# Patient Record
Sex: Male | Born: 1950
Health system: Southern US, Community
[De-identification: ages and names within clinical notes are randomized; demographics above are authoritative.]

## PROBLEM LIST (undated history)

## (undated) DIAGNOSIS — N529 Male erectile dysfunction, unspecified: Secondary | ICD-10-CM

## (undated) DIAGNOSIS — E786 Lipoprotein deficiency: Secondary | ICD-10-CM

## (undated) DIAGNOSIS — K635 Polyp of colon: Secondary | ICD-10-CM

## (undated) DIAGNOSIS — N200 Calculus of kidney: Secondary | ICD-10-CM

## (undated) DIAGNOSIS — I10 Essential (primary) hypertension: Secondary | ICD-10-CM

## (undated) DIAGNOSIS — E785 Hyperlipidemia, unspecified: Secondary | ICD-10-CM

## (undated) DIAGNOSIS — M79605 Pain in left leg: Secondary | ICD-10-CM

## (undated) DIAGNOSIS — R7302 Impaired glucose tolerance (oral): Secondary | ICD-10-CM

## (undated) DIAGNOSIS — F419 Anxiety disorder, unspecified: Secondary | ICD-10-CM

## (undated) DIAGNOSIS — K760 Fatty (change of) liver, not elsewhere classified: Secondary | ICD-10-CM

## (undated) HISTORY — DX: Essential (primary) hypertension: I10

## (undated) HISTORY — DX: Fatty (change of) liver, not elsewhere classified: K76.0

## (undated) HISTORY — DX: Calculus of kidney: N20.0

## (undated) HISTORY — DX: Impaired glucose tolerance (oral): R73.02

## (undated) HISTORY — DX: Anxiety disorder, unspecified: F41.9

## (undated) HISTORY — DX: Polyp of colon: K63.5

## (undated) HISTORY — DX: Male erectile dysfunction, unspecified: N52.9

## (undated) HISTORY — DX: Lipoprotein deficiency: E78.6

## (undated) HISTORY — DX: Pain in left leg: M79.605

---

## 1998-01-26 ENCOUNTER — Emergency Department (HOSPITAL_COMMUNITY): Admission: EM | Admit: 1998-01-26 | Discharge: 1998-01-26 | Payer: Self-pay | Admitting: Emergency Medicine

## 1998-03-07 ENCOUNTER — Ambulatory Visit (HOSPITAL_COMMUNITY): Admission: RE | Admit: 1998-03-07 | Discharge: 1998-03-07 | Payer: Self-pay | Admitting: Family Medicine

## 2001-02-02 ENCOUNTER — Encounter: Payer: Self-pay | Admitting: Emergency Medicine

## 2001-02-02 ENCOUNTER — Emergency Department (HOSPITAL_COMMUNITY): Admission: EM | Admit: 2001-02-02 | Discharge: 2001-02-02 | Payer: Self-pay | Admitting: Emergency Medicine

## 2001-02-03 ENCOUNTER — Emergency Department (HOSPITAL_COMMUNITY): Admission: EM | Admit: 2001-02-03 | Discharge: 2001-02-04 | Payer: Self-pay | Admitting: Emergency Medicine

## 2003-05-31 ENCOUNTER — Encounter (INDEPENDENT_AMBULATORY_CARE_PROVIDER_SITE_OTHER): Payer: Self-pay | Admitting: Specialist

## 2003-05-31 ENCOUNTER — Ambulatory Visit (HOSPITAL_COMMUNITY): Admission: RE | Admit: 2003-05-31 | Discharge: 2003-05-31 | Payer: Self-pay | Admitting: Gastroenterology

## 2005-06-14 ENCOUNTER — Emergency Department (HOSPITAL_COMMUNITY): Admission: EM | Admit: 2005-06-14 | Discharge: 2005-06-14 | Payer: Self-pay | Admitting: Emergency Medicine

## 2005-10-20 ENCOUNTER — Encounter: Payer: Self-pay | Admitting: Family Medicine

## 2007-01-03 ENCOUNTER — Emergency Department (HOSPITAL_COMMUNITY): Admission: EM | Admit: 2007-01-03 | Discharge: 2007-01-03 | Payer: Self-pay | Admitting: Emergency Medicine

## 2010-11-06 NOTE — Op Note (Signed)
NAME:  Keith Bauer, Keith Bauer                      ACCOUNT NO.:  [id number]   MEDICAL RECORD NO.:  [id number]                   PATIENT TYPE:  AMB   LOCATION:  ENDO                                 FACILITY:  [facility]   PHYSICIAN:  Keith Bauer. Keith Bauer., M.D.          DATE OF BIRTH:  [birth year]   DATE OF PROCEDURE:  05/31/2003  DATE OF DISCHARGE:                                 OPERATIVE REPORT   PROCEDURE:  Colonoscopy and coagulation of polyp.   MEDICATIONS:  Fentanyl 50 mcg, Versed 6 mg IV.   SCOPE:  Olympus pediatric adjustable colonoscope.   INDICATIONS FOR PROCEDURE:  Colon cancer screening.   DESCRIPTION OF PROCEDURE:  The procedure had been explained to the patient  and consent obtained. With the patient in the left lateral decubitus  position, the Olympus scope was inserted and advanced. The prep was marginal  in places but adequate.  We were able to reach the cecum, ileocecal valve  and appendiceal orifice were seen. The scope was withdrawn and the cecum,  ascending colon, hepatic flexure, transverse colon, descending and sigmoid  colon were seen well.  No polyps or other lesions were seen. The scope was  withdrawn in the rectum and approximately 6-10 mm from the anal verge, a 3  mm sessile polyp was encountered and was removed with the hot biopsy  forceps.  No gross lesions were seen.  The scope was withdrawn and the  patient tolerated the procedure well.   ASSESSMENT:  Rectal polyp cauterized.   PLAN:  Routine post polypectomy instructions.  Will check path and if  adenomatous will recommend repeating in three years.                                               Keith Bauer. Keith Bauer., M.D.    Waldron Session  D:  05/31/2003  T:  06/01/2003  Job:  [id number]   cc:   Keith Bauer. Little, M.D.  [address]  [place]  [place] [address]  Fax: [phone]

## 2011-04-05 LAB — POCT CARDIAC MARKERS
CKMB, poc: <1 — ABNORMAL LOW
Myoglobin, poc: 73.3
Operator id: 277751
Troponin i, poc: <0.05

## 2011-05-22 ENCOUNTER — Encounter: Payer: Self-pay | Admitting: *Deleted

## 2011-05-22 ENCOUNTER — Emergency Department (INDEPENDENT_AMBULATORY_CARE_PROVIDER_SITE_OTHER): Payer: No Typology Code available for payment source

## 2011-05-22 ENCOUNTER — Emergency Department (HOSPITAL_BASED_OUTPATIENT_CLINIC_OR_DEPARTMENT_OTHER)
Admission: EM | Admit: 2011-05-22 | Discharge: 2011-05-22 | Disposition: A | Discharge status: Home / Self Care | Payer: No Typology Code available for payment source | Attending: Emergency Medicine | Admitting: Emergency Medicine

## 2011-05-22 DIAGNOSIS — R51 Headache: Secondary | ICD-10-CM

## 2011-05-22 DIAGNOSIS — H9319 Tinnitus, unspecified ear: Secondary | ICD-10-CM

## 2011-05-22 DIAGNOSIS — Z79899 Other long term (current) drug therapy: Secondary | ICD-10-CM | POA: Insufficient documentation

## 2011-05-22 DIAGNOSIS — M5380 Other specified dorsopathies, site unspecified: Secondary | ICD-10-CM | POA: Insufficient documentation
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
## 2011-09-27 HISTORY — PX: COLONOSCOPY: SHX174

## 2011-10-15 ENCOUNTER — Other Ambulatory Visit: Payer: Self-pay | Admitting: Gastroenterology

## 2012-04-27 ENCOUNTER — Other Ambulatory Visit: Payer: Self-pay | Admitting: Family Medicine

## 2012-04-27 DIAGNOSIS — R109 Unspecified abdominal pain: Secondary | ICD-10-CM

## 2012-04-28 ENCOUNTER — Ambulatory Visit
Admission: RE | Admit: 2012-04-28 | Discharge: 2012-04-28 | Disposition: A | Discharge status: Home / Self Care | Payer: BC Managed Care – PPO | Source: Ambulatory Visit | Attending: Family Medicine | Admitting: Family Medicine

## 2012-04-28 DIAGNOSIS — R109 Unspecified abdominal pain: Secondary | ICD-10-CM

## 2012-04-28 MED ORDER — IOHEXOL 300 MG/ML  SOLN
100.0000 mL | Freq: Once | INTRAMUSCULAR | Status: AC | PRN
  Administered 2012-04-28: 100 mL via INTRAVENOUS

## 2012-11-19 ENCOUNTER — Emergency Department (HOSPITAL_COMMUNITY): Payer: BC Managed Care – PPO

## 2012-11-19 ENCOUNTER — Emergency Department (HOSPITAL_COMMUNITY)
Admission: EM | Admit: 2012-11-19 | Discharge: 2012-11-19 | Disposition: A | Discharge status: Home / Self Care | Payer: BC Managed Care – PPO | Attending: Emergency Medicine | Admitting: Emergency Medicine

## 2012-11-19 ENCOUNTER — Encounter (HOSPITAL_COMMUNITY): Payer: Self-pay | Admitting: Emergency Medicine

## 2012-11-19 DIAGNOSIS — R079 Chest pain, unspecified: Secondary | ICD-10-CM

## 2012-11-19 DIAGNOSIS — E785 Hyperlipidemia, unspecified: Secondary | ICD-10-CM | POA: Insufficient documentation

## 2012-11-19 DIAGNOSIS — R0989 Other specified symptoms and signs involving the circulatory and respiratory systems: Secondary | ICD-10-CM | POA: Insufficient documentation

## 2012-11-19 DIAGNOSIS — R0600 Dyspnea, unspecified: Secondary | ICD-10-CM

## 2012-11-19 DIAGNOSIS — Z7982 Long term (current) use of aspirin: Secondary | ICD-10-CM | POA: Insufficient documentation

## 2012-11-19 DIAGNOSIS — R0789 Other chest pain: Secondary | ICD-10-CM | POA: Insufficient documentation

## 2012-11-19 DIAGNOSIS — R0609 Other forms of dyspnea: Secondary | ICD-10-CM | POA: Insufficient documentation

## 2012-11-19 DIAGNOSIS — Z79899 Other long term (current) drug therapy: Secondary | ICD-10-CM | POA: Insufficient documentation

## 2012-11-19 HISTORY — DX: Hyperlipidemia, unspecified: E78.5

## 2012-11-19 LAB — POCT I-STAT, CHEM 8
Calcium, Ion: 1.22 mmol/L (ref 1.13–1.30)
Creatinine, Ser: 1.1 mg/dL (ref 0.50–1.35)
Glucose, Bld: 139 mg/dL — ABNORMAL HIGH (ref 70–99)
HCT: 44 % (ref 39.0–52.0)
Hemoglobin: 15 g/dL (ref 13.0–17.0)
Potassium: 3.7 mEq/L (ref 3.5–5.1)
TCO2: 28 mmol/L (ref 0–100)

## 2012-11-19 LAB — CBC WITH DIFFERENTIAL/PLATELET
Basophils Relative: 1 % (ref 0–1)
Eosinophils Absolute: 0.1 10*3/uL (ref 0.0–0.7)
Eosinophils Relative: 5 % (ref 0–5)
Hemoglobin: 15.4 g/dL (ref 13.0–17.0)
Lymphs Abs: 1 10*3/uL (ref 0.7–4.0)
MCH: 31.9 pg (ref 26.0–34.0)
MCHC: 36.1 g/dL — ABNORMAL HIGH (ref 30.0–36.0)
MCV: 88.4 fL (ref 78.0–100.0)
Monocytes Absolute: 0.3 10*3/uL (ref 0.1–1.0)
Monocytes Relative: 10 % (ref 3–12)
Neutrophils Relative %: 51 % (ref 43–77)
RBC: 4.83 MIL/uL (ref 4.22–5.81)

## 2012-11-19 NOTE — ED Notes (Signed)
Patient with increased shortness of breath in the last day, states that it feels like someone is sitting on his chest.  Patient denies any chest pain.  Patient denies any nausea or vomiting.  Patient is CAOx3 at this time.

## 2012-11-19 NOTE — ED Provider Notes (Signed)
History     CSN: [id number]  Arrival date & time 11/19/12  0153   First MD Initiated Contact with Patient 11/19/12 0301      Chief Complaint  Patient presents with  . Shortness of Breath    (Consider location/radiation/quality/duration/timing/severity/associated sxs/prior treatment) The history is provided by the patient.   patient states that his been having episodes of shortness of breath over the last couple weeks. He states it come and go last a few breaths. No chest pain up until he woke tonight. He states he had chest pressure at that time. The shortness of breath has not limited his activity. The pain is resolved now. It lasted an hour. No lightheadedness dizziness. No nausea. No diaphoresis. He has a history of hyperlipidemia but no known coronary disease. He does not smoke.  Past Medical History  Diagnosis Date  . Hyperlipemia     History reviewed. No pertinent past surgical history.  History reviewed. No pertinent family history.  History  Substance Use Topics  . Smoking status: Never Smoker   . Smokeless tobacco: Not on file  . Alcohol Use: Yes     Comment: occ      Review of Systems  Constitutional: Negative for activity change and appetite change.  HENT: Negative for neck stiffness.   Eyes: Negative for pain.  Respiratory: Positive for chest tightness and shortness of breath.   Cardiovascular: Negative for chest pain and leg swelling.  Gastrointestinal: Negative for nausea, vomiting, abdominal pain and diarrhea.  Genitourinary: Negative for flank pain.  Musculoskeletal: Negative for back pain.  Skin: Negative for rash.  Neurological: Negative for weakness, numbness and headaches.  Psychiatric/Behavioral: Negative for behavioral problems.    Allergies  Review of patient's allergies indicates no known allergies.  Home Medications   Current Outpatient Rx  Name  Route  Sig  Dispense  Refill  . ALPRAZolam (XANAX) 0.5 MG tablet   Oral   Take 0.25-0.5  mg by mouth daily as needed for anxiety.         Marland Kitchen aspirin 325 MG tablet   Oral   Take 325 mg by mouth daily.           Marland Kitchen atorvastatin (LIPITOR) 10 MG tablet   Oral   Take 10 mg by mouth daily.           . Multiple Vitamins-Minerals (MULTIVITAMINS THER. W/MINERALS) TABS   Oral   Take 1 tablet by mouth daily.           . sildenafil (VIAGRA) 100 MG tablet   Oral   Take 100 mg by mouth daily as needed for erectile dysfunction.           BP 136/87  Pulse 71  Resp 15  SpO2 99%  Physical Exam  Nursing note and vitals reviewed. Constitutional: He is oriented to person, place, and time. He appears well-developed and well-nourished.  HENT:  Head: Normocephalic and atraumatic.  Eyes: EOM are normal. Pupils are equal, round, and reactive to light.  Neck: Normal range of motion. Neck supple.  Cardiovascular: Normal rate, regular rhythm and normal heart sounds.   No murmur heard. Pulmonary/Chest: Effort normal and breath sounds normal.  Abdominal: Soft. Bowel sounds are normal. He exhibits no distension and no mass. There is no tenderness. There is no rebound and no guarding.  Musculoskeletal: Normal range of motion. He exhibits no edema.  Neurological: He is alert and oriented to person, place, and time. No cranial nerve deficit.  Skin: Skin is warm and dry.  Psychiatric: He has a normal mood and affect.    ED Course  Procedures (including critical care time)  Labs Reviewed  CBC WITH DIFFERENTIAL - Abnormal; Notable for the following:    WBC 2.9 (*)    MCHC 36.1 (*)    Platelets 131 (*)    Neutro Abs 1.5 (*)    All other components within normal limits  POCT I-STAT, CHEM 8 - Abnormal; Notable for the following:    Glucose, Bld 139 (*)    All other components within normal limits  POCT I-STAT TROPONIN I   Dg Chest 2 View  11/19/2012   *RADIOLOGY REPORT*  Clinical Data: Shortness of breath, chest tightness  CHEST - 2 VIEW  Comparison: 05/16/2011  Findings: Lungs  are clear. No pleural effusion or pneumothorax. The cardiomediastinal contours are within normal limits. The visualized bones and soft tissues are without significant appreciable abnormality.  IMPRESSION: No radiographic evidence of acute cardiopulmonary process.   Original Report Authenticated By: Jearld Lesch, M.D.     1. Dyspnea   2. Chest pain      Date: 11/19/2012  Rate: 67  Rhythm: normal sinus rhythm and premature ventricular contractions (PVC)  QRS Axis: normal  Intervals: normal  ST/T Wave abnormalities: nonspecific ST/T changes  Conduction Disutrbances:none  Narrative Interpretation:   Old EKG Reviewed: none available    MDM  Patient with episodes of dyspnea for the last couple weeks. Also episode of chest pain today. EKG is overall reassuring. Enzymes are negative. Pain-free now. He's not had any dyspnea with exertion. He'll be discharged home follow up with his Dr.        Harrold Donath R. Rubin Payor, MD 11/19/12 [phone]

## 2015-09-09 ENCOUNTER — Telehealth: Payer: Self-pay | Admitting: Hematology

## 2015-09-09 ENCOUNTER — Encounter: Payer: Self-pay | Admitting: Hematology

## 2015-09-09 NOTE — Telephone Encounter (Signed)
Sent out New Pt Ref letter to ref provider. Sent out new pt packet to pt.

## 2015-09-17 ENCOUNTER — Ambulatory Visit (INDEPENDENT_AMBULATORY_CARE_PROVIDER_SITE_OTHER): Payer: BLUE CROSS/BLUE SHIELD | Admitting: Neurology

## 2015-09-17 ENCOUNTER — Encounter: Payer: Self-pay | Admitting: Neurology

## 2015-09-17 VITALS — BP 137/84 | HR 67 | Ht 70.0 in | Wt 197.0 lb

## 2015-09-17 DIAGNOSIS — R55 Syncope and collapse: Secondary | ICD-10-CM

## 2015-09-17 DIAGNOSIS — IMO0002 Reserved for concepts with insufficient information to code with codable children: Secondary | ICD-10-CM
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
## 2015-09-23 ENCOUNTER — Ambulatory Visit
Admission: RE | Admit: 2015-09-23 | Discharge: 2015-09-23 | Disposition: A | Discharge status: Home / Self Care | Payer: BLUE CROSS/BLUE SHIELD | Source: Ambulatory Visit | Attending: Neurology | Admitting: Neurology

## 2015-09-23 DIAGNOSIS — IMO0002 Reserved for concepts with insufficient information to code with codable children: Secondary | ICD-10-CM

## 2015-09-23 DIAGNOSIS — R55 Syncope and collapse: Secondary | ICD-10-CM | POA: Diagnosis not present

## 2015-09-24 ENCOUNTER — Ambulatory Visit (HOSPITAL_BASED_OUTPATIENT_CLINIC_OR_DEPARTMENT_OTHER): Payer: BLUE CROSS/BLUE SHIELD | Admitting: Hematology

## 2015-09-24 ENCOUNTER — Telehealth: Payer: Self-pay | Admitting: Hematology

## 2015-09-24 ENCOUNTER — Other Ambulatory Visit (HOSPITAL_BASED_OUTPATIENT_CLINIC_OR_DEPARTMENT_OTHER): Payer: BLUE CROSS/BLUE SHIELD

## 2015-09-24 ENCOUNTER — Encounter: Payer: Self-pay | Admitting: Hematology

## 2015-09-24 VITALS — BP 132/74 | HR 72 | Temp 98.1°F | Resp 18 | Ht 70.0 in | Wt 197.2 lb

## 2015-09-24 DIAGNOSIS — D696 Thrombocytopenia, unspecified: Secondary | ICD-10-CM

## 2015-09-24 DIAGNOSIS — Z808 Family history of malignant neoplasm of other organs or systems: Secondary | ICD-10-CM

## 2015-09-24 DIAGNOSIS — D72819 Decreased white blood cell count, unspecified: Secondary | ICD-10-CM | POA: Insufficient documentation

## 2015-09-24 DIAGNOSIS — Z800 Family history of malignant neoplasm of digestive organs: Secondary | ICD-10-CM

## 2015-09-24 DIAGNOSIS — Z8041 Family history of malignant neoplasm of ovary: Secondary | ICD-10-CM

## 2015-09-24 DIAGNOSIS — D709 Neutropenia, unspecified: Secondary | ICD-10-CM | POA: Diagnosis not present

## 2015-09-24 LAB — CHCC SMEAR

## 2015-09-24 LAB — CBC & DIFF AND RETIC
BASO%: 1.6 % (ref 0.0–2.0)
Basophils Absolute: 0 10*3/uL (ref 0.0–0.1)
EOS%: 2.4 % (ref 0.0–7.0)
Eosinophils Absolute: 0.1 10*3/uL (ref 0.0–0.5)
HEMATOCRIT: 46 % (ref 38.4–49.9)
HGB: 16.2 g/dL (ref 13.0–17.1)
Immature Retic Fract: 4.3 % (ref 3.00–10.60)
LYMPH#: 1.1 10*3/uL (ref 0.9–3.3)
LYMPH%: 45.7 % (ref 14.0–49.0)
MCH: 31.5 pg (ref 27.2–33.4)
MCHC: 35.2 g/dL (ref 32.0–36.0)
MCV: 89.3 fL (ref 79.3–98.0)
MONO#: 0.2 10*3/uL (ref 0.1–0.9)
MONO%: 9.7 % (ref 0.0–14.0)
NEUT#: 1 10*3/uL — ABNORMAL LOW (ref 1.5–6.5)
NEUT%: 40.6 % (ref 39.0–75.0)
NRBC: 0 % (ref 0–0)
Platelets: 136 10*3/uL — ABNORMAL LOW (ref 140–400)
RBC: 5.15 10*6/uL (ref 4.20–5.82)
RDW: 13.4 % (ref 11.0–14.6)
RETIC %: 1.21 % (ref 0.80–1.80)
Retic Ct Abs: 62.32 10*3/uL (ref 34.80–93.90)
WBC: 2.5 10*3/uL — ABNORMAL LOW (ref 4.0–10.3)

## 2015-09-24 LAB — COMPREHENSIVE METABOLIC PANEL
ALK PHOS: 52 U/L (ref 40–150)
ALT: 31 U/L (ref 0–55)
AST: 24 U/L (ref 5–34)
Albumin: 3.7 g/dL (ref 3.5–5.0)
Anion Gap: 5 mEq/L (ref 3–11)
BUN: 15.5 mg/dL (ref 7.0–26.0)
CALCIUM: 9.3 mg/dL (ref 8.4–10.4)
CHLORIDE: 106 meq/L (ref 98–109)
CO2: 30 mEq/L — ABNORMAL HIGH (ref 22–29)
Creatinine: 1.3 mg/dL (ref 0.7–1.3)
EGFR: 68 mL/min/{1.73_m2} — AB (ref >90)
Glucose: 94 mg/dl (ref 70–140)
POTASSIUM: 4.5 meq/L (ref 3.5–5.1)
SODIUM: 141 meq/L (ref 136–145)
Total Bilirubin: 0.48 mg/dL (ref 0.20–1.20)
Total Protein: 7 g/dL (ref 6.4–8.3)

## 2015-09-24 LAB — LACTATE DEHYDROGENASE: LDH: 143 U/L (ref 125–245)

## 2015-09-24 NOTE — Telephone Encounter (Signed)
Gave and pritned appt sched and avs for pt for OCT °

## 2015-09-24 NOTE — Progress Notes (Signed)
Marland Kitchen    HEMATOLOGY/ONCOLOGY CONSULTATION NOTE  Date of Service: 09/24/2015  Patient Care Team: Hulan Fess, MD as PCP - General (Family Medicine)  CHIEF COMPLAINTS/PURPOSE OF CONSULTATION:  Thrombocytopenia and leukopenia  HISTORY OF PRESENTING ILLNESS:  Keith Bauer is a wonderful 65 y.o. male who has been referred to [place] by Dr Gennette Pac, MD for evaluation and management of thrombocytopenia and leukopenia.  Patient has a history of hypertension, dyslipidemia, impaired glucose tolerance, anxiety, erectile dysfunction, fatty liver noted on CT scan of the abdomen and pelvis in 2013, nephrolithiasis. He was referred for evaluation of his chronic leukopenia and thrombocytopenia. Based on review of his labs sent by his primary care physician his hemoglobin has been normal. His platelet counts have been low at least from February 2015 when they were 138k there were 148,000 on 08/22/2014 and recently were 125k on 09/05/2015.  His WBC counts have been around 2.5k with the highest number since 08/03/2013 of 2.8k on 03/15/2014. Noted to have neutropenia with an [place] fluctuating between [phone]. ANC was 1000 on 09/05/2015 with a total WBC count of 2.5k.  He reports no issues with frequent infections or uncontrolled infection. No issues with bleeding or easy bruisability. He had a hepatitis C test on 08/01/2014 which was negative. HIV testing on 08/01/2014 was nonreactive.  Patient notes no familial history of blood disorders, problems of infection.  He notes that he feels well. No fevers or chills. No unexpected weight loss. Good energy levels. No other acute new focal symptoms. Thus take a multivitamin daily. No other recent new medications.Marland Kitchen     MEDICAL HISTORY:  Past Medical History  Diagnosis Date  . Hyperlipemia   . Hypertension   . Impaired glucose tolerance   . Anxiety   . Colon polyp   . Nephrolithiasis     left kidney  . Fatty liver   . Low HDL (under 40)   . Erectile  dysfunction     SURGICAL HISTORY: Past Surgical History  Procedure Laterality Date  . Colonoscopy  04, 08, 13    SOCIAL HISTORY: Social History   Social History  . Marital Status: Single    Spouse Name: N/A  . Number of Children: 3  . Years of Education: 12+   Occupational History  . Social research officer, government   Social History Main Topics  . Smoking status: Never Smoker   . Smokeless tobacco: Not on file  . Alcohol Use: 0.0 oz/week    0 Standard drinks or equivalent per week     Comment: 3-5 drinks per week  . Drug Use: No  . Sexual Activity: Not on file   Other Topics Concern  . Not on file   Social History Narrative   Lives at home alone.   Right-handed.   8-12 ounces caffeine per day.    FAMILY HISTORY: Family History  Problem Relation Age of Onset  . Liver cancer Father   . Cancer Mother     colon, rectal, ovarian  . Heart disease Brother   . Liver disease Sister     ALLERGIES:  has No Known Allergies.  MEDICATIONS:  Current Outpatient Prescriptions  Medication Sig Dispense Refill  . ALPRAZolam (XANAX) 0.5 MG tablet Take 0.25-0.5 mg by mouth daily as needed for anxiety.    Marland Kitchen aspirin 325 MG tablet Take 325 mg by mouth daily.      Marland Kitchen atorvastatin (LIPITOR) 10 MG tablet Take 10 mg by mouth daily.      Marland Kitchen  Multiple Vitamins-Minerals (MULTIVITAMINS THER. W/MINERALS) TABS Take 1 tablet by mouth daily.      Marland Kitchen PARoxetine (PAXIL) 20 MG tablet TK 1 T PO QD IN THE MORNING  3  . sildenafil (VIAGRA) 100 MG tablet Take 100 mg by mouth daily as needed for erectile dysfunction.     No current facility-administered medications for this visit.    REVIEW OF SYSTEMS:    10 Point review of Systems was done is negative except as noted above.  PHYSICAL EXAMINATION: ECOG PERFORMANCE STATUS: 0 - Asymptomatic  . Filed Vitals:   09/24/15 1053  BP: 132/74  Pulse: 72  Temp: 98.1 F (36.7 C)  Resp: 18   Filed Weights   09/24/15 1053  Weight: 197 lb  3.2 oz (89.449 kg)   .Body mass index is 28.3 kg/(m^2).  GENERAL:alert, in no acute distress and comfortable SKIN: skin color, texture, turgor are normal, no rashes or significant lesions EYES: normal, conjunctiva are pink and non-injected, sclera clear OROPHARYNX:no exudate, no erythema and lips, buccal mucosa, and tongue normal  NECK: supple, no JVD, thyroid normal size, non-tender, without nodularity LYMPH:  no palpable lymphadenopathy in the cervical, axillary or inguinal LUNGS: clear to auscultation with normal respiratory effort HEART: regular rate & rhythm,  no murmurs and no lower extremity edema ABDOMEN: abdomen soft, non-tender, normoactive bowel sounds , No palpable hepatosplenomegaly  Musculoskeletal: no cyanosis of digits and no clubbing  PSYCH: alert & oriented x 3 with fluent speech NEURO: no focal motor/sensory deficits  LABORATORY DATA:  I have reviewed the data as listed  . CBC Latest Ref Rng 11/19/2012 11/19/2012  WBC 4.0 - 10.5 K/uL - 2.9(L)  Hemoglobin 13.0 - 17.0 g/dL 15.0 15.4  Hematocrit 39.0 - 52.0 % 44.0 42.7  Platelets 150 - 400 K/uL - 131(L)    . CMP Latest Ref Rng 11/19/2012  Glucose 70 - 99 mg/dL 139(H)  BUN 6 - 23 mg/dL 14  Creatinine 0.50 - 1.35 mg/dL 1.10  Sodium 135 - 145 mEq/L 142  Potassium 3.5 - 5.1 mEq/L 3.7  Chloride 96 - 112 mEq/L 105     RADIOGRAPHIC STUDIES: I have personally reviewed the radiological images as listed and agreed with the findings in the report. No results found.  ASSESSMENT & PLAN:   65 year old African-American male with  #1 Chronic thrombocytopenia at least from June 2014. Without significant worsening. No issues with bleeding despite being on a full dose aspirin daily.  Denies drug abuse/alcohol abuse. No fevers/chills/night sweats/unexpected weight loss/lymphadenopathy/hepatosplenomegaly to suggest a lymphoproliferative disorder. Cannot rule out an element of clumping. No clinical stigmata of chronic  autoimmune disease.  #2 mild leukopenia with neutropenia. No issues with frequent infections. This is also been at least since June 2014. 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 No evidence of overt autoimmune condition or specific medication exposures. Denies alcohol abuse or drug use. Plan -We'll get a CBC, CMP, peripheral blood smear, B12, LDH, copper. -No indication for G-CSF at this time. -We'll review the peripheral blood smear to rule out any unusual lymphocytes suggesting chronic lymphoproliferative process. Would also help ruling out pseudothrombocytopenia due to EDTA-related platelet clumping. -No clear indications for bone marrow biopsy at this time. -Primary care physician might consider an ultrasound of the abdomen to evaluate his liver again and spleen size to rule out splenomegaly as an etiology for his chronic stable cytopenias . -No problems with continuing aspirin at this time given his current platelet count. Would hold this if for whatever reason the  platelet counts drop to under 75k.  We will call the patient with his lab results as they become available. Unless anything else concerning is noted we will plan to see the patient back in 6 months to monitor his blood counts at least once more and then discharge him to his primary care physician if no other concerns arise.     Orders Placed This Encounter  Procedures  . CBC & Diff and Retic    Standing Status: Future     Number of Occurrences:      Standing Expiration Date: 10/28/2016  . Comprehensive metabolic panel    Standing Status: Future     Number of Occurrences:      Standing Expiration Date: 09/23/2016  . Smear    Standing Status: Future     Number of Occurrences:      Standing Expiration Date: 09/23/2016  . Lactate dehydrogenase (LDH)    Standing Status: Future     Number of Occurrences:      Standing Expiration Date: 09/23/2016  . Vitamin B12    Standing Status: Future     Number of Occurrences:      Standing  Expiration Date: 09/23/2016  . Copper, serum    Standing Status: Future     Number of Occurrences:      Standing Expiration Date: 09/23/2016  . CBC & Diff and Retic    Standing Status: Future     Number of Occurrences:      Standing Expiration Date: 10/28/2016  . Comprehensive metabolic panel    Standing Status: Future     Number of Occurrences:      Standing Expiration Date: 09/23/2016    Plan of care was discussed with the patient and he is agreeable. All of the patients questions were answered  to his apparent satisfaction. The patient knows to call the clinic with any problems, questions or concerns.  I spent 45 minutes counseling the patient face to face. The total time spent in the appointment was 55 minutes and more than 50% was on counseling and direct patient cares.    Sullivan Lone MD [place] AAHIVMS [facility] Hematology/Oncology Physician [facility]  (Office):       [phone] (Work cell):  [phone] (Fax):           [phone]  09/24/2015 10:42 AM

## 2015-09-24 NOTE — Addendum Note (Signed)
Addended by: Kellie Simmering A on: 09/24/2015 12:39 PM   Modules accepted: Medications

## 2015-09-25 LAB — VITAMIN B12: Vitamin B12: 495 pg/mL (ref 211–946)

## 2015-09-26 LAB — COPPER, SERUM: Copper: 83 ug/dL (ref 72–166)

## 2015-09-30 ENCOUNTER — Ambulatory Visit (INDEPENDENT_AMBULATORY_CARE_PROVIDER_SITE_OTHER): Payer: BLUE CROSS/BLUE SHIELD

## 2015-09-30 DIAGNOSIS — R55 Syncope and collapse: Secondary | ICD-10-CM

## 2015-09-30 DIAGNOSIS — IMO0002 Reserved for concepts with insufficient information to code with codable children: Secondary | ICD-10-CM

## 2015-09-30 NOTE — Progress Notes (Signed)
The results of the EEG study were discussed with the patient. The study was unremarkable.

## 2015-09-30 NOTE — Procedures (Signed)
    History:  Keith Bauer is a 65 year old gentleman with a history of episodes of syncope that occurred 8 times over the last 6 years. The patient last had an event in September 2016 associated with racing of the heart, lightheaded sensations, and vomiting. The patient is being evaluated for these episodes.  This is a routine EEG. No skull defects are noted. Medications include Xanax, aspirin, Lipitor, Cozaar, multivitamins, Paxil, and Viagra.   EEG classification: Normal awake and drowsy  Description of the recording: The background rhythms of this recording consists of a fairly well modulated medium amplitude alpha rhythm of 10 Hz that is reactive to eye opening and closure. As the record progresses, the patient appears to remain in the waking state throughout the recording. Photic stimulation was performed, resulting in a bilateral and symmetric photic driving response. Hyperventilation was also performed, resulting in a minimal buildup of the background rhythm activities without significant slowing seen. Toward the end of the recording, the patient enters the drowsy state with slight symmetric slowing seen. The patient never enters stage II sleep. At no time during the recording does there appear to be evidence of spike or spike wave discharges or evidence of focal slowing. EKG monitor shows no evidence of cardiac rhythm abnormalities with a heart rate of 66.  Impression: This is a normal EEG recording in the waking and drowsy state. No evidence of ictal or interictal discharges are seen.

## 2015-10-02 ENCOUNTER — Other Ambulatory Visit: Payer: Self-pay

## 2015-10-02 ENCOUNTER — Ambulatory Visit (HOSPITAL_COMMUNITY): Payer: BLUE CROSS/BLUE SHIELD | Attending: Cardiovascular Disease

## 2015-10-02 DIAGNOSIS — IMO0002 Reserved for concepts with insufficient information to code with codable children: Secondary | ICD-10-CM

## 2015-10-02 DIAGNOSIS — E785 Hyperlipidemia, unspecified: Secondary | ICD-10-CM | POA: Diagnosis not present

## 2015-10-02 DIAGNOSIS — I340 Nonrheumatic mitral (valve) insufficiency: Secondary | ICD-10-CM | POA: Diagnosis not present

## 2015-10-02 DIAGNOSIS — R55 Syncope and collapse: Secondary | ICD-10-CM

## 2015-10-06 ENCOUNTER — Telehealth: Payer: Self-pay | Admitting: Neurology

## 2015-10-06 NOTE — Telephone Encounter (Signed)
LVM about normal ECHO results per Dr Krista Blue. Gave [facility] phone number if he has further questions.

## 2015-10-06 NOTE — Telephone Encounter (Signed)
Please call patient for normal echocardiogram.

## 2015-10-30 ENCOUNTER — Encounter: Payer: Self-pay | Admitting: Neurology

## 2015-10-30 ENCOUNTER — Ambulatory Visit (INDEPENDENT_AMBULATORY_CARE_PROVIDER_SITE_OTHER): Payer: BLUE CROSS/BLUE SHIELD | Admitting: Neurology

## 2015-10-30 VITALS — BP 121/72 | HR 64 | Ht 70.0 in | Wt 196.8 lb

## 2015-10-30 DIAGNOSIS — R55 Syncope and collapse: Secondary | ICD-10-CM | POA: Diagnosis not present

## 2015-10-30 DIAGNOSIS — IMO0002 Reserved for concepts with insufficient information to code with codable children: Secondary | ICD-10-CM

## 2015-10-30 NOTE — Progress Notes (Signed)
Chief Complaint  Patient presents with  . Passout Episodes    He would like to review hie MRI, EEG and ECHO results. He has not worn a cardiac monitor yet.  Reports no further passing out episodes.      PATIENT: Keith Bauer DOB: 01-22-1951  Chief Complaint  Patient presents with  . Passout Episodes    He would like to review hie MRI, EEG and ECHO results. He has not worn a cardiac monitor yet.  Reports no further passing out episodes.     [place], seen in refer byHis primary care physician Dr. 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 , while he was taking pictures with his friend, he suddenly felt lightheaded, heart racing fast, he was able to sit down, later he lost consciousness, vomiting, he was in and out of consciousness few times in a short period of time, there was no seizure activity noticed, he denies tongue biting, no urinary incontinence, he was taken to [facility], I was able to review laboratory evaluations, normal CMP with exception of elevated glucose 170, normal CBC with exception of mildly low platelet 139, troponin was negative, mild elevated CPK 230  Over the past 6 years, since 2011, he had intermittent 8 episodes of passing out episode, usually preceded by heart racing fast, sweaty, lightheadedness sensation, he often has reaction time to find a secure spot to sit down, he never had self injury   He denies history of seizure, there was no seizure activity described, he does see a psychiatrist for anxiety related to driving through bridge, prolonged traveling in the car  Laboratory evaluation  in September 05 2015, showed low WBC 2.5, hemoglobin 17 point 4 which was elevated, platelet 148 mildly decreased, normal CMP with exception of mild elevated glucose 103, creatinine was 1.25, with GFR 58, normal PSA 0.87, mild elevated LDL 102    Update Oct 30 2015:  I personally reviewed MRI brain without contrast that was normal, EEG was normal, echocardiogram was normal, Cardiac monitoring is pending, he has no recurrent episode, continue exercise without difficulty  REVIEW OF SYSTEMS: Full 14 system review of systems performed and notable only for as above   ALLERGIES: No Known Allergies  HOME MEDICATIONS: Current Outpatient Prescriptions  Medication Sig Dispense Refill  . ALPRAZolam (XANAX) 0.5 MG tablet Take 0.25-0.5 mg by mouth daily as needed for anxiety.    Marland Kitchen aspirin 325 MG tablet Take 325 mg by mouth daily.      Marland Kitchen atorvastatin (LIPITOR) 10 MG tablet Take 10 mg by mouth daily.      Marland Kitchen losartan (COZAAR) 50 MG tablet Take 50 mg by mouth daily.    . Multiple Vitamins-Minerals (MULTIVITAMINS THER. W/MINERALS) TABS Take 1 tablet by mouth daily.      Marland Kitchen PARoxetine (PAXIL) 20 MG tablet TK 1 T PO QD IN THE MORNING  3  . sildenafil (VIAGRA) 100 MG tablet Take 100 mg by mouth daily as needed for erectile dysfunction.     No current facility-administered medications for this visit.    PAST MEDICAL HISTORY: Past Medical History  Diagnosis Date  . Hyperlipemia   .  Hypertension   . Impaired glucose tolerance   . Anxiety   . Colon polyp   . Nephrolithiasis     left kidney  . Fatty liver   . Low HDL (under 40)   . Erectile dysfunction     PAST SURGICAL HISTORY: Past Surgical History  Procedure Laterality Date  . Colonoscopy  04, 08, 13    FAMILY HISTORY: Family History  Problem Relation Age of Onset  . Liver cancer Father   . Cancer Mother     colon, rectal, ovarian  . Heart disease Brother   . Liver disease Sister     SOCIAL HISTORY:  Social History   Social History    . Marital Status: Single    Spouse Name: N/A  . Number of Children: 3  . Years of Education: 12+   Occupational History  . Social research officer, government   Social History Main Topics  . Smoking status: Never Smoker   . Smokeless tobacco: Not on file  . Alcohol Use: 0.0 oz/week    0 Standard drinks or equivalent per week     Comment: 3-5 drinks per week  . Drug Use: No  . Sexual Activity: Not on file   Other Topics Concern  . Not on file   Social History Narrative   Lives at home alone.   Right-handed.   8-12 ounces caffeine per day.     PHYSICAL EXAM   Filed Vitals:   10/30/15 1628  BP: 121/72  Pulse: 64  Height: 5\' 10"  (1.778 m)  Weight: 196 lb 12 oz (89.245 kg)    Not recorded      Body mass index is 28.23 kg/(m^2).  PHYSICAL EXAMNIATION:  Gen: NAD, conversant, well nourised, obese, well groomed                     Cardiovascular: Regular rate rhythm, no peripheral edema, warm, nontender. Eyes: Conjunctivae clear without exudates or hemorrhage Neck: Supple, no carotid bruise. Pulmonary: Clear to auscultation bilaterally   NEUROLOGICAL EXAM:  MENTAL STATUS: Speech:    Speech is normal; fluent and spontaneous with normal comprehension.  Cognition:     Orientation to time, place and person     Normal recent and remote memory     Normal Attention span and concentration     Normal Language, naming, repeating,spontaneous speech     Fund of knowledge   CRANIAL NERVES: CN II: Visual fields are full to confrontation. Fundoscopic exam is normal with sharp discs and no vascular changes. Pupils are round equal and briskly reactive to light. CN III, IV, VI: extraocular movement are normal. No ptosis. CN V: Facial sensation is intact to pinprick in all 3 divisions bilaterally. Corneal responses are intact.  CN VII: Face is symmetric with normal eye closure and smile. CN VIII: Hearing is normal to rubbing fingers CN IX, X: Palate elevates  symmetrically. Phonation is normal. CN XI: Head turning and shoulder shrug are intact CN XII: Tongue is midline with normal movements and no atrophy.  MOTOR: There is no pronator drift of out-stretched arms. Muscle bulk and tone are normal. Muscle strength is normal.  REFLEXES: Reflexes are 2+ and symmetric at the biceps, triceps, knees, and ankles. Plantar responses are flexor.  SENSORY: Intact to light touch, pinprick, positional sensation and vibratory sensation are intact in fingers and toes.  COORDINATION: Rapid alternating movements and fine finger movements are intact. There is no dysmetria on finger-to-nose  and heel-knee-shin.    GAIT/STANCE: Posture is normal. Gait is steady with normal steps, base, arm swing, and turning. Heel and toe walking are normal. Tandem gait is normal.  Romberg is absent.   DIAGNOSTIC DATA (LABS, IMAGING, TESTING) - I reviewed patient records, labs, notes, testing and imaging myself where available.   ASSESSMENT AND PLAN  Keith Bauer is a 65 y.o. male    Passing out episode:  Most suspicious for cardiac etiology such as cardiac arrhythmia  Refer him to Cardiac monitoring  Marcial Pacas, M.D. Ph.D.  [facility] [address], [place], [place] [address] Ph: [phone] Fax: [phone]  CC: Hulan Fess, MD

## 2015-11-04 ENCOUNTER — Ambulatory Visit (INDEPENDENT_AMBULATORY_CARE_PROVIDER_SITE_OTHER): Payer: BLUE CROSS/BLUE SHIELD

## 2015-11-04 DIAGNOSIS — R55 Syncope and collapse: Secondary | ICD-10-CM

## 2016-03-29 ENCOUNTER — Telehealth: Payer: Self-pay | Admitting: Hematology

## 2016-03-29 NOTE — Telephone Encounter (Signed)
COVERING AP - MOVED FROM 10/11 TO 10/17. SPOKE WITH PATIENT HE IS AWARE.

## 2016-03-31 ENCOUNTER — Other Ambulatory Visit: Payer: BLUE CROSS/BLUE SHIELD

## 2016-03-31 ENCOUNTER — Ambulatory Visit: Payer: BLUE CROSS/BLUE SHIELD | Admitting: Hematology

## 2016-04-06 ENCOUNTER — Other Ambulatory Visit (HOSPITAL_BASED_OUTPATIENT_CLINIC_OR_DEPARTMENT_OTHER): Payer: BLUE CROSS/BLUE SHIELD

## 2016-04-06 ENCOUNTER — Ambulatory Visit (HOSPITAL_BASED_OUTPATIENT_CLINIC_OR_DEPARTMENT_OTHER): Payer: BLUE CROSS/BLUE SHIELD | Admitting: Hematology

## 2016-04-06 VITALS — BP 131/83 | HR 82 | Temp 98.3°F | Resp 18 | Ht 70.0 in | Wt 203.1 lb

## 2016-04-06 DIAGNOSIS — D696 Thrombocytopenia, unspecified: Secondary | ICD-10-CM | POA: Diagnosis not present

## 2016-04-06 DIAGNOSIS — D709 Neutropenia, unspecified: Secondary | ICD-10-CM

## 2016-04-06 DIAGNOSIS — D72819 Decreased white blood cell count, unspecified: Secondary | ICD-10-CM

## 2016-04-06 LAB — CBC & DIFF AND RETIC
BASO%: 0.7 % (ref 0.0–2.0)
Basophils Absolute: 0 10*3/uL (ref 0.0–0.1)
EOS%: 2.2 % (ref 0.0–7.0)
Eosinophils Absolute: 0.1 10*3/uL (ref 0.0–0.5)
HCT: 46.8 % (ref 38.4–49.9)
HGB: 16.2 g/dL (ref 13.0–17.1)
Immature Retic Fract: 5.6 % (ref 3.00–10.60)
LYMPH%: 37.7 % (ref 14.0–49.0)
MCH: 31.2 pg (ref 27.2–33.4)
MCHC: 34.6 g/dL (ref 32.0–36.0)
MCV: 90 fL (ref 79.3–98.0)
MONO#: 0.3 10*3/uL (ref 0.1–0.9)
MONO%: 11.6 % (ref 0.0–14.0)
NEUT%: 47.8 % (ref 39.0–75.0)
NEUTROS ABS: 1.3 10*3/uL — AB (ref 1.5–6.5)
Platelets: 129 10*3/uL — ABNORMAL LOW (ref 140–400)
RBC: 5.2 10*6/uL (ref 4.20–5.82)
RDW: 13.6 % (ref 11.0–14.6)
Retic %: 1.44 % (ref 0.80–1.80)
Retic Ct Abs: 74.88 10*3/uL (ref 34.80–93.90)
WBC: 2.8 10*3/uL — AB (ref 4.0–10.3)
lymph#: 1 10*3/uL (ref 0.9–3.3)

## 2016-04-06 LAB — COMPREHENSIVE METABOLIC PANEL
ALK PHOS: 64 U/L (ref 40–150)
ALT: 35 U/L (ref 0–55)
AST: 31 U/L (ref 5–34)
Albumin: 3.7 g/dL (ref 3.5–5.0)
Anion Gap: 10 mEq/L (ref 3–11)
BUN: 17.7 mg/dL (ref 7.0–26.0)
CALCIUM: 9.6 mg/dL (ref 8.4–10.4)
CHLORIDE: 104 meq/L (ref 98–109)
CO2: 28 mEq/L (ref 22–29)
Creatinine: 1.4 mg/dL — ABNORMAL HIGH (ref 0.7–1.3)
EGFR: 59 mL/min/{1.73_m2} — AB (ref >90)
GLUCOSE: 105 mg/dL (ref 70–140)
POTASSIUM: 4.2 meq/L (ref 3.5–5.1)
SODIUM: 142 meq/L (ref 136–145)
Total Bilirubin: 0.4 mg/dL (ref 0.20–1.20)
Total Protein: 7.3 g/dL (ref 6.4–8.3)

## 2016-05-03 ENCOUNTER — Encounter: Payer: Self-pay | Admitting: Neurology

## 2016-05-03 ENCOUNTER — Ambulatory Visit (INDEPENDENT_AMBULATORY_CARE_PROVIDER_SITE_OTHER): Payer: BLUE CROSS/BLUE SHIELD | Admitting: Neurology

## 2016-05-03 VITALS — BP 131/78 | HR 70 | Ht 70.0 in | Wt 198.5 lb

## 2016-05-03 DIAGNOSIS — R55 Syncope and collapse: Secondary | ICD-10-CM | POA: Diagnosis not present

## 2016-05-03 DIAGNOSIS — IMO0002 Reserved for concepts with insufficient information to code with codable children: Secondary | ICD-10-CM

## 2016-05-03 NOTE — Progress Notes (Signed)
Chief Complaint  Patient presents with  . Passing Out Episode    Reports one passing out event since last seen in May 2017.  He would like to further review his cardiac monitoring results.      PATIENT: Keith Bauer DOB: 1950-09-27  Chief Complaint  Patient presents with  . Passing Out Episode    Reports one passing out event since last seen in May 2017.  He would like to further review his cardiac monitoring results.     [place], seen in refer byHis primary care physician Dr. Hulan Fess for evaluation of recurrent episode of passing out, Initial evaluation was on September 17 2015  I reviewed and summarized the referring note, he had a history of hypertension, hyperlipidemia, chronic anxiety, impaired glucose intolerance, kidney stone, fatty liver  He reported recurrent episode of passing out, the most recent one was in September 2016, he just finished his 65 m competitions , while he was taking pictures with his friend, he suddenly felt lightheaded, heart racing fast, he was able to sit down, later he lost consciousness, vomiting, he was in and out of consciousness few times in a short period of time, there was no seizure activity noticed, he denies tongue biting, no urinary incontinence, he was taken to [facility], I was able to review laboratory evaluations, normal CMP with exception of elevated glucose 170, normal CBC with exception of mildly low platelet 139, troponin was negative, mild elevated CPK 230  Over the past 6 years, since 2011, he had intermittent 8 episodes of passing out episode, usually preceded by heart racing fast, sweaty, lightheadedness sensation, he often has reaction time to find a secure spot to sit down, he never had self injury   He denies history of seizure, there was no seizure activity described, he does see a psychiatrist for anxiety related to driving through bridge, prolonged traveling in the car  Laboratory  evaluation in September 05 2015, showed low WBC 2.5, hemoglobin 17 point 4 which was elevated, platelet 148 mildly decreased, normal CMP with exception of mild elevated glucose 103, creatinine was 1.25, with GFR 58, normal PSA 0.87, mild elevated LDL 102    Update Oct 30 2015:  I personally reviewed MRI brain without contrast that was normal, EEG was normal, echocardiogram was normal,  Update May 03 2016, He reported on episode in summer of 2017, he was walking in the park, felt lightheaded, he sat down at the bench, went to sleep, came around with out self injury, but it was very on usual to him to sleep in the park, 30 day cardiac monitoring in May 2017 was normal, patient reported that he did wear monitoring during that episode.   I have reviewed cardiac monitoring in May 2017, sinus rhythm, no arrhythmia detected  REVIEW OF SYSTEMS: Full 14 system review of systems performed and notable only for as above   ALLERGIES: No Known Allergies  HOME MEDICATIONS: Current Outpatient Prescriptions  Medication Sig Dispense Refill  . ALPRAZolam (XANAX) 0.5 MG tablet Take 0.25-0.5 mg by mouth daily as needed for anxiety.    Marland Kitchen aspirin 325 MG tablet Take 325 mg by mouth daily.      Marland Kitchen atorvastatin (LIPITOR) 10 MG tablet Take 10 mg by mouth daily.      Marland Kitchen losartan (COZAAR) 50 MG tablet Take 50 mg by mouth daily.    . Multiple Vitamins-Minerals (MULTIVITAMINS THER. W/MINERALS) TABS Take 1 tablet by mouth daily.      Marland Kitchen  PARoxetine (PAXIL) 20 MG tablet TK 1 T PO QD IN THE MORNING  3  . sildenafil (VIAGRA) 100 MG tablet Take 100 mg by mouth daily as needed for erectile dysfunction.     No current facility-administered medications for this visit.     PAST MEDICAL HISTORY: Past Medical History:  Diagnosis Date  . Anxiety   . Colon polyp   . Erectile dysfunction   . Fatty liver   . Hyperlipemia   . Hypertension   . Impaired glucose tolerance   . Low HDL (under 40)   . Nephrolithiasis    left  kidney    PAST SURGICAL HISTORY: Past Surgical History:  Procedure Laterality Date  . COLONOSCOPY  04, 08, 13    FAMILY HISTORY: Family History  Problem Relation Age of Onset  . Liver cancer Father   . Cancer Mother     colon, rectal, ovarian  . Heart disease Brother   . Liver disease Sister     SOCIAL HISTORY:  Social History   Social History  . Marital status: Single    Spouse name: N/A  . Number of children: 3  . Years of education: 12+   Occupational History  . Social research officer, government   Social History Main Topics  . Smoking status: Never Smoker  . Smokeless tobacco: Not on file  . Alcohol use 0.0 oz/week     Comment: 3-5 drinks per week  . Drug use: No  . Sexual activity: Not on file   Other Topics Concern  . Not on file   Social History Narrative   Lives at home alone.   Right-handed.   8-12 ounces caffeine per day.     PHYSICAL EXAM   Vitals:   05/03/16 1602  BP: 131/78  Pulse: 70  Weight: 198 lb 8 oz (90 kg)  Height: 5\' 10"  (1.778 m)    Not recorded      Body mass index is 28.48 kg/m.  PHYSICAL EXAMNIATION:  Gen: NAD, conversant, well nourised, obese, well groomed                     Cardiovascular: Regular rate rhythm, no peripheral edema, warm, nontender. Eyes: Conjunctivae clear without exudates or hemorrhage Neck: Supple, no carotid bruise. Pulmonary: Clear to auscultation bilaterally   NEUROLOGICAL EXAM:  MENTAL STATUS: Speech:    Speech is normal; fluent and spontaneous with normal comprehension.  Cognition:     Orientation to time, place and person     Normal recent and remote memory     Normal Attention span and concentration     Normal Language, naming, repeating,spontaneous speech     Fund of knowledge   CRANIAL NERVES: CN II: Visual fields are full to confrontation. Fundoscopic exam is normal with sharp discs and no vascular changes. Pupils are round equal and briskly reactive to light. CN  III, IV, VI: extraocular movement are normal. No ptosis. CN V: Facial sensation is intact to pinprick in all 3 divisions bilaterally. Corneal responses are intact.  CN VII: Face is symmetric with normal eye closure and smile. CN VIII: Hearing is normal to rubbing fingers CN IX, X: Palate elevates symmetrically. Phonation is normal. CN XI: Head turning and shoulder shrug are intact CN XII: Tongue is midline with normal movements and no atrophy.  MOTOR: There is no pronator drift of out-stretched arms. Muscle bulk and tone are normal. Muscle strength is normal.  REFLEXES: Reflexes are  2+ and symmetric at the biceps, triceps, knees, and ankles. Plantar responses are flexor.  SENSORY: Intact to light touch, pinprick, positional sensation and vibratory sensation are intact in fingers and toes.  COORDINATION: Rapid alternating movements and fine finger movements are intact. There is no dysmetria on finger-to-nose and heel-knee-shin.    GAIT/STANCE: Posture is normal. Gait is steady with normal steps, base, arm swing, and turning. Heel and toe walking are normal. Tandem gait is normal.  Romberg is absent.   DIAGNOSTIC DATA (LABS, IMAGING, TESTING) - I reviewed patient records, labs, notes, testing and imaging myself where available.   ASSESSMENT AND PLAN  DEBORA SHIRAH is a 65 y.o. male    Passing out episode:  Most suspicious for cardiac etiology such as cardiac arrhythmia  Refer him to cardiologist  Cardiac monitoring in May 2017 showed no significant abnormality  Neurology workup including MRI of the brain and EEG was normal  Advise him continues to document event  Marcial Pacas, M.D. Ph.D.  [facility] [address], [place], [place] [address] Ph: [phone] Fax: [phone]  CC: Hulan Fess, MD

## 2016-05-27 ENCOUNTER — Encounter: Payer: Self-pay | Admitting: Cardiology

## 2016-05-27 ENCOUNTER — Ambulatory Visit (INDEPENDENT_AMBULATORY_CARE_PROVIDER_SITE_OTHER): Payer: BLUE CROSS/BLUE SHIELD | Admitting: Cardiology

## 2016-05-27 DIAGNOSIS — R55 Syncope and collapse: Secondary | ICD-10-CM

## 2016-05-27 MED ORDER — METOPROLOL TARTRATE 25 MG PO TABS: 25.0000 mg | ORAL_TABLET | Freq: Two times a day (BID) | ORAL | 6 refills | Status: DC | PRN

## 2016-05-27 NOTE — Patient Instructions (Signed)
TAKE METOPROLOL TARTRATE  25 MG  UP TO TWICE A DAY TAKE HALF AN HOUR  PRIOR TO ANY ANXIOUS SITUATION OR DRIVING LONG DISTANCE AS NEEDED.  Your physician wants you to follow-up in:  [place].You will receive a reminder letter in the mail two months in advance. If you don't receive a letter, please call our office to schedule the follow-up appointment.  If you need a refill on your cardiac medications before your next appointment, please call your pharmacy.

## 2016-05-27 NOTE — Progress Notes (Signed)
PCP: Gennette Pac, MD  Clinic Note: Chief Complaint  Patient presents with  . New Patient (Initial Visit)    pt states he is here due to having thoughts of passing out when he is in stressful situations, example driving for long periods of time and crossing over a bridge with water under it   . Loss of Consciousness    1 episode about a year+ ago.  Otw near syncope.    HPI: Keith Bauer is a 65 y.o. male with a PMH below who presents today for cardiology consultation to evaluate history of syncope. Apparently this was evaluated with an echocardiogram, EEG and MRI - by Neurology   Keith Bauer was last seen on May 11 by Dr. Krista Blue from neurology. Otherwise her history of recurrent passing out episodes beginning back in September 2016. The initial episode began shortly after he completed 100 m sprint race. He was taking pictures and suddenly began to feel his heart racing fast. He felt lightheaded, and then lost consciousness shortly after sitting down. Apparently he was in out of consciousness. He was admitted to [facility] and had mildly elevated CPK but was otherwise normal. Since then he's had at least 8 if not more episodes of passing out each preceded by rapid heartbeats.  2 more since he saw Dr. Krista Blue.   Recent Hospitalizations: No hospital visits since September 2016  Studies Reviewed:   30 Event monitor June 2017: No cardiac abnormalities. (he notes having "something" similar to his episodes while wearing the monitor -- ? Fell asleep sitting down @ the park.  Transthoracic Echo June 2017: EF 55-60%. Normal LV size and function. No regional wall motion abnormality  Interval History: Keith Bauer is here for evaluation of episodes of near syncope and near passout spells. He really hasn't had a passout spell since last fall when he was at the field event and passed out. He has had several episodes where he felt as though he may pass out, but is not truly passed out. All  told he may be only had about 3-4 episodes of truly passing out. Usually he has a sense that he may pass out a sense of anxiety overcoming him an overwhelming him where he feels a sense of lightheadedness and a weird strange sensation in his head and chest. He doesn't necessarily notice any sensation of rapid heart rates, but does feel that feels tired at the wrist episodes are over. He does not saline note is heartbeat is irregular.  He basically describes to me that ever since childhood he has had a fear of water and not then passed on as he grew up and started to drive that he was scared of driving over bridges. That then progressed to simply be scared of driving with his a potential during his trip that he may have to drive over a bridge. Anxious just simply thinking about driving. Sometimes when he is driving he will have an episode where he'll get what sounds like a panic attack feeling flushed and lightheaded and will feel as though he may pass out.    No chest pain or shortness of breath with rest or exertion.  No PND, orthopnea or edema.  No TIA/amaurosis fugax symptoms. No claudication.  ROS: A comprehensive was performed. Review of Systems  Constitutional: Negative for chills, fever and malaise/fatigue.  HENT: Negative.   Eyes: Negative.   Respiratory: Negative.   Cardiovascular: Positive for palpitations.  Gastrointestinal: Negative.   Genitourinary: Negative.  Musculoskeletal: Negative.   Neurological: Positive for dizziness. Negative for loss of consciousness (None since last fall).  Psychiatric/Behavioral: The patient is nervous/anxious.   All other systems reviewed and are negative.   Past Medical History:  Diagnosis Date  . Anxiety   . Colon polyp   . Erectile dysfunction   . Fatty liver   . Hyperlipemia   . Hypertension   . Impaired glucose tolerance   . Low HDL (under 40)   . Nephrolithiasis    left kidney    Past Surgical History:  Procedure Laterality Date   . COLONOSCOPY  04, 08, 13    Current Meds  Medication Sig  . ALPRAZolam (XANAX) 0.5 MG tablet Take 0.25-0.5 mg by mouth daily as needed for anxiety.  Marland Kitchen aspirin 325 MG tablet Take 325 mg by mouth daily.    Marland Kitchen atorvastatin (LIPITOR) 10 MG tablet Take 10 mg by mouth daily.    Marland Kitchen losartan (COZAAR) 50 MG tablet Take 50 mg by mouth daily.  . Multiple Vitamins-Minerals (MULTIVITAMINS THER. W/MINERALS) TABS Take 1 tablet by mouth daily.    Marland Kitchen PARoxetine (PAXIL) 20 MG tablet TK 1 T PO QD IN THE MORNING  . sildenafil (VIAGRA) 100 MG tablet Take 100 mg by mouth daily as needed for erectile dysfunction.    No Known Allergies  Social History   Social History  . Marital status: Single    Spouse name: N/A  . Number of children: 3  . Years of education: 12+   Occupational History  . Social research officer, government   Social History Main Topics  . Smoking status: Never Smoker  . Smokeless tobacco: Never Used  . Alcohol use 0.0 oz/week     Comment: 3-5 drinks per week  . Drug use: No  . Sexual activity: Not Asked   Other Topics Concern  . None   Social History Narrative   Lives at home alone.   Right-handed.   8-12 ounces caffeine per day.    family history includes Cancer in his mother; Heart disease in his brother; Liver cancer in his father; Liver disease in his sister.  Wt Readings from Last 3 Encounters:  05/27/16 89.8 kg (198 lb)  05/03/16 90 kg (198 lb 8 oz)  04/06/16 92.1 kg (203 lb 1.6 oz)    PHYSICAL EXAM Ht 5\' 9"  (1.753 m)   Wt 89.8 kg (198 lb)   SpO2 (!) 84%   BMI 29.24 kg/m   Orthostatics read: No data found. -- not consistent with orthostatic hypotension General appearance: alert, cooperative, appears stated age, no distress and borderline obese; well nourished & well groomed. HEENT: [place]/AT, EOMI, MMM, anicteric sclera Neck: no adenopathy, no carotid bruit and no JVD Lungs: clear to auscultation bilaterally, normal percussion bilaterally and  non-labored Heart: regular rate and rhythm, S1, S2 normal, no murmur, click, rub or gallop; non-displaced PMI Abdomen: soft, non-tender; bowel sounds normal; no masses,  no organomegaly; no HJR Extremities: extremities normal, atraumatic, no cyanosis, or edema  Pulses: 2+ and symmetric;  Skin: mobility and turgor normal, no evidence of bleeding or bruising and no lesions noted  Neurologic: Mental status: Alert, oriented, thought content appropriate Cranial nerves: normal (II-XII grossly intact)    Adult ECG Report Not checked  Other studies Reviewed: Additional studies/ records that were reviewed today include:  Recent Labs:   Lab Results  Component Value Date   CREATININE 1.4 (H) 04/06/2016   BUN 17.7 04/06/2016   NA  142 04/06/2016   K 4.2 04/06/2016   CL 105 11/19/2012   CO2 28 04/06/2016      ASSESSMENT / PLAN: Problem List Items Addressed This Visit    Syncope, non cardiac (Chronic)    I think the episode he had last when he had just run a race and then passed out is probably related to overheating and dehydration. He had no sensation of rapid irregular heartbeats etc. Probably not a cardiac etiology however.      Relevant Medications   metoprolol tartrate (LOPRESSOR) 25 MG tablet   Syncope, near (Chronic)    He seems to be minimizing his symptoms. All seem to be associated with some type of anxiety phenomenon which means or probably more psychogenic been actually cardiac in nature. Sounds like he is having panic attacks. He basically had no events on 30 day event monitor despite having an episode where he felt "similar symptoms of his episodes described. He also had a pretty normal echocardiogram. Heart is stated this any true cardiac etiology.  Because he does have a sense of increased heart rate is episodes, I think we can try to do a when necessary beta blocker for when he is in a stressful situation. Try metoprolol tartrate 25 mg that he would take 30 minutes prior  to a situation such as driving. He can use up to 2 a day.      Relevant Medications   metoprolol tartrate (LOPRESSOR) 25 MG tablet      Current medicines are reviewed at length with the patient today. (+/- concerns) none The following changes have been made: non  Patient Instructions  TAKE METOPROLOL TARTRATE  25 MG  UP TO TWICE A DAY TAKE HALF AN HOUR  PRIOR TO ANY ANXIOUS SITUATION OR DRIVING LONG DISTANCE AS NEEDED.  Your physician wants you to follow-up in:  [place].You will receive a reminder letter in the mail two months in advance. If you don't receive a letter, please call our office to schedule the follow-up appointment.  If you need a refill on your cardiac medications before your next appointment, please call your pharmacy.   Studies Ordered:   No orders of the defined types were placed in this encounter.     Glenetta Hew, M.D., M.S. Interventional Cardiologist   Pager # [phone] Phone # [phone] [address] [place], [place] [address]

## 2016-05-29 ENCOUNTER — Encounter: Payer: Self-pay | Admitting: Cardiology

## 2016-05-29 DIAGNOSIS — R55 Syncope and collapse: Secondary | ICD-10-CM | POA: Insufficient documentation

## 2016-05-29 NOTE — Assessment & Plan Note (Addendum)
He seems to be minimizing his symptoms. All seem to be associated with some type of anxiety phenomenon which means or probably more psychogenic been actually cardiac in nature. Sounds like he is having panic attacks. He basically had no events on 30 day event monitor despite having an episode where he felt "similar symptoms of his episodes described. He also had a pretty normal echocardiogram. Heart is stated this any true cardiac etiology.  Because he does have a sense of increased heart rate is episodes, I think we can try to do a when necessary beta blocker for when he is in a stressful situation. Try metoprolol tartrate 25 mg that he would take 30 minutes prior to a situation such as driving. He can use up to 2 a day.

## 2016-05-29 NOTE — Assessment & Plan Note (Signed)
I think the episode he had last when he had just run a race and then passed out is probably related to overheating and dehydration. He had no sensation of rapid irregular heartbeats etc. Probably not a cardiac etiology however.

## 2016-05-31 NOTE — Progress Notes (Signed)
I have reviewed and agreed above plan. 

## 2016-10-22 DIAGNOSIS — Z125 Encounter for screening for malignant neoplasm of prostate: Secondary | ICD-10-CM | POA: Diagnosis not present

## 2016-10-22 DIAGNOSIS — I10 Essential (primary) hypertension: Secondary | ICD-10-CM | POA: Diagnosis not present

## 2016-10-27 DIAGNOSIS — K760 Fatty (change of) liver, not elsewhere classified: Secondary | ICD-10-CM | POA: Diagnosis not present

## 2016-10-27 DIAGNOSIS — E785 Hyperlipidemia, unspecified: Secondary | ICD-10-CM | POA: Diagnosis not present

## 2016-10-27 DIAGNOSIS — Z0000 Encounter for general adult medical examination without abnormal findings: Secondary | ICD-10-CM | POA: Diagnosis not present

## 2016-10-27 DIAGNOSIS — Z86010 Personal history of colonic polyps: Secondary | ICD-10-CM | POA: Diagnosis not present

## 2016-10-27 DIAGNOSIS — J309 Allergic rhinitis, unspecified: Secondary | ICD-10-CM | POA: Diagnosis not present

## 2016-10-27 DIAGNOSIS — D696 Thrombocytopenia, unspecified: Secondary | ICD-10-CM | POA: Diagnosis not present

## 2016-10-27 DIAGNOSIS — R69 Illness, unspecified: Secondary | ICD-10-CM | POA: Diagnosis not present

## 2016-10-27 DIAGNOSIS — R7301 Impaired fasting glucose: Secondary | ICD-10-CM | POA: Diagnosis not present

## 2016-10-27 DIAGNOSIS — I10 Essential (primary) hypertension: Secondary | ICD-10-CM | POA: Diagnosis not present

## 2016-11-02 ENCOUNTER — Telehealth: Payer: Self-pay | Admitting: Hematology

## 2016-11-02 NOTE — Telephone Encounter (Signed)
Scheduled appt per sch message from [facility] - left message with appt date and time and sent reminder letter in the mail.

## 2016-11-07 NOTE — Progress Notes (Signed)
Marland Kitchen    HEMATOLOGY/ONCOLOGY CLINIC NOTE  Date of Service: .04/06/2016  Patient Care Team: Hulan Fess, MD as PCP - General (Family Medicine)  CHIEF COMPLAINTS/PURPOSE OF CONSULTATION:  Thrombocytopenia and leukopenia  HISTORY OF PRESENTING ILLNESS:  Keith Bauer is a wonderful 66 y.o. male who has been referred to [place] by Dr Hulan Fess, MD for evaluation and management of thrombocytopenia and leukopenia.  Patient has a history of hypertension, dyslipidemia, impaired glucose tolerance, anxiety, erectile dysfunction, fatty liver noted on CT scan of the abdomen and pelvis in 2013, nephrolithiasis. He was referred for evaluation of his chronic leukopenia and thrombocytopenia. Based on review of his labs sent by his primary care physician his hemoglobin has been normal. His platelet counts have been low at least from February 2015 when they were 138k there were 148,000 on 08/22/2014 and recently were 125k on 09/05/2015.  His WBC counts have been around 2.5k with the highest number since 08/03/2013 of 2.8k on 03/15/2014. Noted to have neutropenia with an [place] fluctuating between [phone]. ANC was 1000 on 09/05/2015 with a total WBC count of 2.5k.  He reports no issues with frequent infections or uncontrolled infection. No issues with bleeding or easy bruisability. He had a hepatitis C test on 08/01/2014 which was negative. HIV testing on 08/01/2014 was nonreactive.  Patient notes no familial history of blood disorders, problems of infection.  He notes that he feels well. No fevers or chills. No unexpected weight loss. Good energy levels. No other acute new focal symptoms. Thus take a multivitamin daily. No other recent new medications..   INTERVAL HISTORY  Patient is here for follow-up of his idiopathic thrombocytopenia and leukopenia. he notes no acute issues since his last visit about 6 months ago. No issues with bleeding or bruising. No new infections. No fatigue no shortness of breath no  chest pain. No fevers or chills no night sweats. Blood counts today show some improvement in his WBC count of 2.8k from 2.5k. ANC is up from 1002 1300 .Platelet counts are stable at 129k. Notes no new medications since last visit.   MEDICAL HISTORY:  Past Medical History:  Diagnosis Date  . Anxiety   . Colon polyp   . Erectile dysfunction   . Fatty liver   . Hyperlipemia   . Hypertension   . Impaired glucose tolerance   . Low HDL (under 40)   . Nephrolithiasis    left kidney    SURGICAL HISTORY: Past Surgical History:  Procedure Laterality Date  . COLONOSCOPY  04, 08, 13    SOCIAL HISTORY: Social History   Social History  . Marital status: Single    Spouse name: N/A  . Number of children: 3  . Years of education: 12+   Occupational History  . Social research officer, government   Social History Main Topics  . Smoking status: Never Smoker  . Smokeless tobacco: Never Used  . Alcohol use 0.0 oz/week     Comment: 3-5 drinks per week  . Drug use: No  . Sexual activity: Not on file   Other Topics Concern  . Not on file   Social History Narrative   Lives at home alone.   Right-handed.   8-12 ounces caffeine per day.    FAMILY HISTORY: Family History  Problem Relation Age of Onset  . Cancer Mother        colon, rectal, ovarian  . Liver cancer Father   . Heart disease Brother   .  Liver disease Sister     ALLERGIES:  has No Known Allergies.  MEDICATIONS:  Current Outpatient Prescriptions  Medication Sig Dispense Refill  . ALPRAZolam (XANAX) 0.5 MG tablet Take 0.25-0.5 mg by mouth daily as needed for anxiety.    Marland Kitchen aspirin 325 MG tablet Take 325 mg by mouth daily.      Marland Kitchen atorvastatin (LIPITOR) 10 MG tablet Take 10 mg by mouth daily.      Marland Kitchen losartan (COZAAR) 50 MG tablet Take 50 mg by mouth daily.    . metoprolol tartrate (LOPRESSOR) 25 MG tablet Take 1 tablet (25 mg total) by mouth 2 (two) times daily as needed. 60 tablet 6  . Multiple  Vitamins-Minerals (MULTIVITAMINS THER. W/MINERALS) TABS Take 1 tablet by mouth daily.      Marland Kitchen PARoxetine (PAXIL) 20 MG tablet TK 1 T PO QD IN THE MORNING  3  . sildenafil (VIAGRA) 100 MG tablet Take 100 mg by mouth daily as needed for erectile dysfunction.     No current facility-administered medications for this visit.     REVIEW OF SYSTEMS:    10 Point review of Systems was done is negative except as noted above.  PHYSICAL EXAMINATION: ECOG PERFORMANCE STATUS: 0 - Asymptomatic  . Vitals:   04/06/16 1501  BP: 131/83  Pulse: 82  Resp: 18  Temp: 98.3 F (36.8 C)   Filed Weights   04/06/16 1501  Weight: 203 lb 1.6 oz (92.1 kg)   .Body mass index is 29.14 kg/m.  GENERAL:alert, in no acute distress and comfortable SKIN: skin color, texture, turgor are normal, no rashes or significant lesions EYES: normal, conjunctiva are pink and non-injected, sclera clear OROPHARYNX:no exudate, no erythema and lips, buccal mucosa, and tongue normal  NECK: supple, no JVD, thyroid normal size, non-tender, without nodularity LYMPH:  no palpable lymphadenopathy in the cervical, axillary or inguinal LUNGS: clear to auscultation with normal respiratory effort HEART: regular rate & rhythm,  no murmurs and no lower extremity edema ABDOMEN: abdomen soft, non-tender, normoactive bowel sounds , No palpable hepatosplenomegaly  Musculoskeletal: no cyanosis of digits and no clubbing  PSYCH: alert & oriented x 3 with fluent speech NEURO: no focal motor/sensory deficits  LABORATORY DATA:  I have reviewed the data as listed  . CBC Latest Ref Rng & Units 04/06/2016 09/24/2015 11/19/2012  WBC 4.0 - 10.3 10e3/uL 2.8(L) 2.5(L) -  Hemoglobin 13.0 - 17.1 g/dL 16.2 16.2 15.0  Hematocrit 38.4 - 49.9 % 46.8 46.0 44.0  Platelets 140 - 400 10e3/uL 129(L) 136 Few Large & giant, Occ small plt clump(L) -   ANC 1000---> 1300. Marland Kitchen CMP Latest Ref Rng & Units 04/06/2016 09/24/2015 11/19/2012  Glucose 70 - 140 mg/dl 105 94  139(H)  BUN 7.0 - 26.0 mg/dL 17.7 15.5 14  Creatinine 0.7 - 1.3 mg/dL 1.4(H) 1.3 1.10  Sodium 136 - 145 mEq/L 142 141 142  Potassium 3.5 - 5.1 mEq/L 4.2 4.5 3.7  Chloride 96 - 112 mEq/L - - 105  CO2 22 - 29 mEq/L 28 30(H) -  Calcium 8.4 - 10.4 mg/dL 9.6 9.3 -  Total Protein 6.4 - 8.3 g/dL 7.3 7.0 -  Total Bilirubin 0.20 - 1.20 mg/dL 0.40 0.48 -  Alkaline Phos 40 - 150 U/L 64 52 -  AST 5 - 34 U/L 31 24 -  ALT 0 - 55 U/L 35 31 -     RADIOGRAPHIC STUDIES: I have personally reviewed the radiological images as listed and agreed with the findings in  the report. No results found.  ASSESSMENT & PLAN:   66 year old African-American male with  #1 Chronic thrombocytopenia at least from June 2014. Without significant worsening. No issues with bleeding despite being on a full dose aspirin daily.  Denies drug abuse/alcohol abuse. No fevers/chills/night sweats/unexpected weight loss/lymphadenopathy/hepatosplenomegaly to suggest a lymphoproliferative disorder. No clinical stigmata of chronic autoimmune disease. Platelet counts stable at 129k today.  #2 mild leukopenia with neutropenia. No issues with frequent infections. This is also been at least since June 2014. 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 No evidence of overt autoimmune condition or specific medication exposures. Denies alcohol abuse or drug use. WBC have improved from 2.5k to 2.8K with improvement in [place] from 1000-->1300. Plan -Lab results discussed in details with the patient. His overall blood counts are somewhat improved or stable and his clinical status is unchanged with no constitutional symptoms, bone pains. No issues with bleeding or infections. -No indication for G-CSF at this time. -No clear indications for bone marrow biopsy at this time but would consider this if his blood counts worsened. -Primary care physician might consider an ultrasound of the abdomen to evaluate his liver again and spleen size to rule out  splenomegaly as an etiology for his chronic stable cytopenias  especially since he has previously been found to have fatty liver on CT scan in 2013. -No problems with continuing aspirin at this time given his current platelet count. Would hold this if for whatever reason the platelet counts drop to under 75k.  Continue follow-up with primary care physician. Would recommend monitoring CBC every 6 months. Return to care with [place] on an as-needed basis if any significant changes in blood counts are noted.  All of the patients questions were answered  to his apparent satisfaction. The patient knows to call the clinic with any problems, questions or concerns.  I spent 15 minutes counseling the patient face to face. The total time spent in the appointment was 25 minutes and more than 50% was on counseling and direct patient cares.    Sullivan Lone MD [place] AAHIVMS [facility] Hematology/Oncology Physician [facility]  (Office):       [phone] (Work cell):  [phone] (Fax):           [phone]

## 2016-11-18 DIAGNOSIS — H5203 Hypermetropia, bilateral: Secondary | ICD-10-CM | POA: Diagnosis not present

## 2016-12-14 DIAGNOSIS — R05 Cough: Secondary | ICD-10-CM | POA: Diagnosis not present

## 2017-01-06 ENCOUNTER — Other Ambulatory Visit (HOSPITAL_BASED_OUTPATIENT_CLINIC_OR_DEPARTMENT_OTHER): Payer: Medicare HMO

## 2017-01-06 ENCOUNTER — Ambulatory Visit (HOSPITAL_BASED_OUTPATIENT_CLINIC_OR_DEPARTMENT_OTHER): Payer: Medicare HMO | Admitting: Hematology

## 2017-01-06 ENCOUNTER — Encounter: Payer: Self-pay | Admitting: Hematology

## 2017-01-06 ENCOUNTER — Other Ambulatory Visit: Payer: Self-pay | Admitting: Hematology

## 2017-01-06 VITALS — BP 122/67 | HR 81 | Temp 98.2°F | Resp 18 | Ht 69.0 in | Wt 197.6 lb

## 2017-01-06 DIAGNOSIS — D709 Neutropenia, unspecified: Secondary | ICD-10-CM | POA: Diagnosis not present

## 2017-01-06 DIAGNOSIS — D72819 Decreased white blood cell count, unspecified: Secondary | ICD-10-CM

## 2017-01-06 DIAGNOSIS — D696 Thrombocytopenia, unspecified: Secondary | ICD-10-CM

## 2017-01-06 LAB — CBC & DIFF AND RETIC
BASO%: 0.8 % (ref 0.0–2.0)
BASOS ABS: 0 10*3/uL (ref 0.0–0.1)
EOS%: 2.3 % (ref 0.0–7.0)
Eosinophils Absolute: 0.1 10*3/uL (ref 0.0–0.5)
HEMATOCRIT: 46.6 % (ref 38.4–49.9)
HGB: 15.8 g/dL (ref 13.0–17.1)
IMMATURE RETIC FRACT: 8.4 % (ref 3.00–10.60)
LYMPH#: 1.1 10*3/uL (ref 0.9–3.3)
LYMPH%: 42 % (ref 14.0–49.0)
MCH: 30.7 pg (ref 27.2–33.4)
MCHC: 33.9 g/dL (ref 32.0–36.0)
MCV: 90.7 fL (ref 79.3–98.0)
MONO#: 0.3 10*3/uL (ref 0.1–0.9)
MONO%: 10.1 % (ref 0.0–14.0)
NEUT#: 1.2 10*3/uL — ABNORMAL LOW (ref 1.5–6.5)
NEUT%: 44.8 % (ref 39.0–75.0)
PLATELETS: 148 10*3/uL (ref 140–400)
RBC: 5.14 10*6/uL (ref 4.20–5.82)
RDW: 13.4 % (ref 11.0–14.6)
RETIC CT ABS: 82.75 10*3/uL (ref 34.80–93.90)
Retic %: 1.61 % (ref 0.80–1.80)
WBC: 2.6 10*3/uL — ABNORMAL LOW (ref 4.0–10.3)

## 2017-01-06 LAB — COMPREHENSIVE METABOLIC PANEL
ALK PHOS: 61 U/L (ref 40–150)
ALT: 28 U/L (ref 0–55)
ANION GAP: 6 meq/L (ref 3–11)
AST: 21 U/L (ref 5–34)
Albumin: 3.8 g/dL (ref 3.5–5.0)
BILIRUBIN TOTAL: 0.59 mg/dL (ref 0.20–1.20)
BUN: 15.5 mg/dL (ref 7.0–26.0)
CALCIUM: 9.6 mg/dL (ref 8.4–10.4)
CO2: 29 mEq/L (ref 22–29)
CREATININE: 1.3 mg/dL (ref 0.7–1.3)
Chloride: 105 mEq/L (ref 98–109)
EGFR: 64 mL/min/{1.73_m2} — AB (ref >90)
Glucose: 124 mg/dl (ref 70–140)
Potassium: 4.2 mEq/L (ref 3.5–5.1)
Sodium: 140 mEq/L (ref 136–145)
TOTAL PROTEIN: 7.2 g/dL (ref 6.4–8.3)

## 2017-01-06 LAB — LACTATE DEHYDROGENASE: LDH: 140 U/L (ref 125–245)

## 2017-01-06 NOTE — Patient Instructions (Signed)
Thank you for choosing [facility] to provide your oncology and hematology care.  To afford each patient quality time with our providers, please arrive 30 minutes before your scheduled appointment time.  If you arrive late for your appointment, you may be asked to reschedule.  We strive to give you quality time with our providers, and arriving late affects you and other patients whose appointments are after yours.   If you are a no show for multiple scheduled visits, you may be dismissed from the clinic at the providers discretion.    Again, thank you for choosing [facility], our hope is that these requests will decrease the amount of time that you wait before being seen by our physicians.  ______________________________________________________________________  Should you have questions after your visit to the [facility], please contact our office at [phone] between the hours of 8:30 and 4:30 p.m.    Voicemails left after 4:30p.m will not be returned until the following business day.    For prescription refill requests, please have your pharmacy contact us directly.  Please also try to allow 48 hours for prescription requests.    Please contact the scheduling department for questions regarding scheduling.  For scheduling of procedures such as PET scans, CT scans, MRI, Ultrasound, etc please contact central scheduling at [phone].    Resources For Cancer Patients and Caregivers:   Oncolink.org:  A wonderful resource for patients and healthcare providers for information regarding your disease, ways to tract your treatment, what to expect, etc.     American Cancer Society:  800-227-2345  Can help patients locate various types of support and financial assistance  Cancer Care: 1-800-813-HOPE (4673) Provides financial assistance, online support groups, medication/co-pay assistance.    [place] DSS:  336-641-3447 Where to apply for food  stamps, Medicaid, and utility assistance  Medicare Rights Center: 800-333-4114 Helps people with Medicare understand their rights and benefits, navigate the Medicare system, and secure the quality healthcare they deserve  SCAT: 336-333-6589 [place] [employer]'s shared-ride transportation service for eligible riders who have a disability that prevents them from riding the fixed route bus.    For additional information on assistance programs please contact our social worker:   Grier Hock/Abigail Elmore:  [phone]            

## 2017-01-07 LAB — VITAMIN B12: VITAMIN B 12: 628 pg/mL (ref 232–1245)

## 2017-01-09 NOTE — Progress Notes (Signed)
Marland Kitchen    HEMATOLOGY/ONCOLOGY CLINIC NOTE  Date of Service: .01/06/2017  Patient Care Team: Hulan Fess, MD as PCP - General (Family Medicine)  CHIEF COMPLAINTS/PURPOSE OF CONSULTATION:  Thrombocytopenia and leukopenia  HISTORY OF PRESENTING ILLNESS:  Keith Bauer is a wonderful 66 y.o. male who has been referred to [place] by Dr Hulan Fess, MD for evaluation and management of thrombocytopenia and leukopenia.  Patient has a history of hypertension, dyslipidemia, impaired glucose tolerance, anxiety, erectile dysfunction, fatty liver noted on CT scan of the abdomen and pelvis in 2013, nephrolithiasis. He was referred for evaluation of his chronic leukopenia and thrombocytopenia. Based on review of his labs sent by his primary care physician his hemoglobin has been normal. His platelet counts have been low at least from February 2015 when they were 138k there were 148,000 on 08/22/2014 and recently were 125k on 09/05/2015.  His WBC counts have been around 2.5k with the highest number since 08/03/2013 of 2.8k on 03/15/2014. Noted to have neutropenia with an [place] fluctuating between [phone]. ANC was 1000 on 09/05/2015 with a total WBC count of 2.5k.  He reports no issues with frequent infections or uncontrolled infection. No issues with bleeding or easy bruisability. He had a hepatitis C test on 08/01/2014 which was negative. HIV testing on 08/01/2014 was nonreactive.  Patient notes no familial history of blood disorders, problems of infection.  He notes that he feels well. No fevers or chills. No unexpected weight loss. Good energy levels. No other acute new focal symptoms. Thus take a multivitamin daily. No other recent new medications..   INTERVAL HISTORY  Patient is here for follow-up of his idiopathic thrombocytopenia and leukopenia. We were re-consulted to continue following him for this.  He notes that he has been feeling great with no issues with fatigue, infections, abnormal bleeding or  bruising.  no new medications since last visit but reports increase in Paxil dose from 36m to 440mpo daily. He has had the low WBC count since at least 2014. We discussed whether he would want to consider a bone marrow examination for more definitive evaluation but in the absence of symptoms or progressive clinical picture he makes a reasonable decision to hold off on this. His platelet counts are within normal limits at 148k today. Hemoglobin is normal at 15.8. WBC counts slightly low at 2.6k with an ANC of 1.2k. No other acute focal symptoms. No bone pains. No constitutional symptoms. No note of enlarged lymph nodes.   MEDICAL HISTORY:  Past Medical History:  Diagnosis Date  . Anxiety   . Colon polyp   . Erectile dysfunction   . Fatty liver   . Hyperlipemia   . Hypertension   . Impaired glucose tolerance   . Low HDL (under 40)   . Nephrolithiasis    left kidney    SURGICAL HISTORY: Past Surgical History:  Procedure Laterality Date  . COLONOSCOPY  04, 08, 13    SOCIAL HISTORY: Social History   Social History  . Marital status: Single    Spouse name: N/A  . Number of children: 3  . Years of education: 12+   Occupational History  . PrSocial research officer, government Social History Main Topics  . Smoking status: Never Smoker  . Smokeless tobacco: Never Used  . Alcohol use 0.0 oz/week     Comment: 3-5 drinks per week  . Drug use: No  . Sexual activity: Not on file   Other Topics  Concern  . Not on file   Social History Narrative   Lives at home alone.   Right-handed.   8-12 ounces caffeine per day.    FAMILY HISTORY: Family History  Problem Relation Age of Onset  . Cancer Mother        colon, rectal, ovarian  . Liver cancer Father   . Heart disease Brother   . Liver disease Sister     ALLERGIES:  has No Known Allergies.  MEDICATIONS:  Current Outpatient Prescriptions  Medication Sig Dispense Refill  . ALPRAZolam (XANAX) 0.5 MG tablet  Take 0.25-0.5 mg by mouth daily as needed for anxiety.    Marland Kitchen aspirin 325 MG tablet Take 325 mg by mouth daily.      Marland Kitchen atorvastatin (LIPITOR) 10 MG tablet Take 10 mg by mouth daily.      Marland Kitchen losartan (COZAAR) 50 MG tablet Take 50 mg by mouth daily.    . Multiple Vitamins-Minerals (MULTIVITAMINS THER. W/MINERALS) TABS Take 1 tablet by mouth daily.      Marland Kitchen PARoxetine (PAXIL) 20 MG tablet TK 1 T PO QD IN THE MORNING  3  . sildenafil (VIAGRA) 100 MG tablet Take 100 mg by mouth daily as needed for erectile dysfunction.    . metoprolol tartrate (LOPRESSOR) 25 MG tablet Take 1 tablet (25 mg total) by mouth 2 (two) times daily as needed. 60 tablet 6   No current facility-administered medications for this visit.     REVIEW OF SYSTEMS:    10 Point review of Systems was done is negative except as noted above.  PHYSICAL EXAMINATION: ECOG PERFORMANCE STATUS: 0 - Asymptomatic  . Vitals:   01/06/17 1403  BP: 122/67  Pulse: 81  Resp: 18  Temp: 98.2 F (36.8 C)   Filed Weights   01/06/17 1403  Weight: 197 lb 9.6 oz (89.6 kg)   .Body mass index is 29.18 kg/m.  GENERAL:alert, in no acute distress and comfortable SKIN: skin color, texture, turgor are normal, no rashes or significant lesions EYES: normal, conjunctiva are pink and non-injected, sclera clear OROPHARYNX:no exudate, no erythema and lips, buccal mucosa, and tongue normal  NECK: supple, no JVD, thyroid normal size, non-tender, without nodularity LYMPH:  no palpable lymphadenopathy in the cervical, axillary or inguinal LUNGS: clear to auscultation with normal respiratory effort HEART: regular rate & rhythm,  no murmurs and no lower extremity edema ABDOMEN: abdomen soft, non-tender, normoactive bowel sounds , No palpable hepatosplenomegaly  Musculoskeletal: no cyanosis of digits and no clubbing  PSYCH: alert & oriented x 3 with fluent speech NEURO: no focal motor/sensory deficits  LABORATORY DATA:  I have reviewed the data as  listed  . CBC Latest Ref Rng & Units 01/06/2017 04/06/2016 09/24/2015  WBC 4.0 - 10.3 10e3/uL 2.6(L) 2.8(L) 2.5(L)  Hemoglobin 13.0 - 17.1 g/dL 15.8 16.2 16.2  Hematocrit 38.4 - 49.9 % 46.6 46.8 46.0  Platelets 140 - 400 10e3/uL 148 129(L) 136 Few Large & giant, Occ small plt clump(L)   ANC 1000---> 1300. Marland Kitchen CMP Latest Ref Rng & Units 01/06/2017 04/06/2016 09/24/2015  Glucose 70 - 140 mg/dl 124 105 94  BUN 7.0 - 26.0 mg/dL 15.5 17.7 15.5  Creatinine 0.7 - 1.3 mg/dL 1.3 1.4(H) 1.3  Sodium 136 - 145 mEq/L 140 142 141  Potassium 3.5 - 5.1 mEq/L 4.2 4.2 4.5  Chloride 96 - 112 mEq/L - - -  CO2 22 - 29 mEq/L 29 28 30(H)  Calcium 8.4 - 10.4 mg/dL 9.6 9.6 9.3  Total Protein 6.4 - 8.3 g/dL 7.2 7.3 7.0  Total Bilirubin 0.20 - 1.20 mg/dL 0.59 0.40 0.48  Alkaline Phos 40 - 150 U/L 61 64 52  AST 5 - 34 U/L _0 ALT 0 - 55 U/L 28 35 31     RADIOGRAPHIC STUDIES: I have personally reviewed the radiological images as listed and agreed with the findings in the report. No results found.  ASSESSMENT & PLAN:   66 year old African-American male with  #1 Chronic thrombocytopenia at least from June 2014. Without significant worsening. No issues with bleeding despite being on a full dose aspirin daily.  Denies drug abuse/alcohol abuse. No fevers/chills/night sweats/unexpected weight loss/lymphadenopathy/hepatosplenomegaly to suggest a lymphoproliferative disorder. No clinical stigmata of chronic autoimmune disease. Platelet counts have now normalized at 148k  #2 mild leukopenia with neutropenia. No issues with frequent infections. This is also been at least since June 2014. 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 No evidence of overt autoimmune condition or specific medication exposures. Denies alcohol abuse or drug use. WBC have remained stable in the 2.5k-3k range with stable ANC in in 1000-1300 range (1200 today) Plan -Lab results discussed in details with the patient. His overall blood counts are  somewhat improved or stable and his clinical status is unchanged with no constitutional symptoms, bone pains. No issues with bleeding or infections. -No indication for G-CSF at this time. -No clear indications for bone marrow biopsy at this time but would consider this if his blood counts worsened. Patient is in agreement with this plan -would recommend Primary care physician consider an ultrasound of the abdomen to evaluate his liver again and spleen size to rule out splenomegaly as an etiology for his chronic stable cytopenias  especially since he has previously been found to have fatty liver on CT scan in 2013. -No problems with continuing aspirin at this time given his current platelet count. Would hold this if for whatever reason the platelet counts drop to under 75k.  Continue follow-up with primary care physician.  RTC with Dr Irene Limbo in 5-6 months with labs  All of the patients questions were answered  to his apparent satisfaction. The patient knows to call the clinic with any problems, questions or concerns.  I spent 15 minutes counseling the patient face to face. The total time spent in the appointment was 20 minutes and more than 50% was on counseling and direct patient cares.    Sullivan Lone MD [place] AAHIVMS [facility] Hematology/Oncology Physician [facility]  (Office):       [phone] (Work cell):  [phone] (Fax):           [phone]

## 2017-02-04 DIAGNOSIS — R69 Illness, unspecified: Secondary | ICD-10-CM | POA: Diagnosis not present

## 2017-02-08 DIAGNOSIS — Z0100 Encounter for examination of eyes and vision without abnormal findings: Secondary | ICD-10-CM | POA: Diagnosis not present

## 2017-03-04 DIAGNOSIS — R2232 Localized swelling, mass and lump, left upper limb: Secondary | ICD-10-CM | POA: Diagnosis not present

## 2017-03-04 DIAGNOSIS — R229 Localized swelling, mass and lump, unspecified: Secondary | ICD-10-CM | POA: Diagnosis not present

## 2017-04-13 DIAGNOSIS — R69 Illness, unspecified: Secondary | ICD-10-CM | POA: Diagnosis not present

## 2017-05-26 DIAGNOSIS — G5702 Lesion of sciatic nerve, left lower limb: Secondary | ICD-10-CM | POA: Diagnosis not present

## 2017-06-09 ENCOUNTER — Other Ambulatory Visit (HOSPITAL_BASED_OUTPATIENT_CLINIC_OR_DEPARTMENT_OTHER): Payer: Medicare HMO

## 2017-06-09 ENCOUNTER — Encounter: Payer: Self-pay | Admitting: Hematology

## 2017-06-09 ENCOUNTER — Ambulatory Visit (HOSPITAL_BASED_OUTPATIENT_CLINIC_OR_DEPARTMENT_OTHER): Payer: Medicare HMO | Admitting: Hematology

## 2017-06-09 VITALS — BP 166/77 | HR 88 | Temp 98.3°F | Resp 18 | Ht 69.0 in | Wt 197.1 lb

## 2017-06-09 DIAGNOSIS — D696 Thrombocytopenia, unspecified: Secondary | ICD-10-CM | POA: Diagnosis not present

## 2017-06-09 DIAGNOSIS — M25552 Pain in left hip: Secondary | ICD-10-CM

## 2017-06-09 DIAGNOSIS — D709 Neutropenia, unspecified: Secondary | ICD-10-CM

## 2017-06-09 LAB — CBC & DIFF AND RETIC
BASO%: 0.6 % (ref 0.0–2.0)
BASOS ABS: 0 10*3/uL (ref 0.0–0.1)
EOS ABS: 0.1 10*3/uL (ref 0.0–0.5)
EOS%: 2.5 % (ref 0.0–7.0)
HEMATOCRIT: 47.9 % (ref 38.4–49.9)
HEMOGLOBIN: 16.6 g/dL (ref 13.0–17.1)
Immature Retic Fract: 8.9 % (ref 3.00–10.60)
LYMPH#: 1.2 10*3/uL (ref 0.9–3.3)
LYMPH%: 38.7 % (ref 14.0–49.0)
MCH: 31.8 pg (ref 27.2–33.4)
MCHC: 34.7 g/dL (ref 32.0–36.0)
MCV: 91.8 fL (ref 79.3–98.0)
MONO#: 0.2 10*3/uL (ref 0.1–0.9)
MONO%: 7.2 % (ref 0.0–14.0)
NEUT#: 1.6 10*3/uL (ref 1.5–6.5)
NEUT%: 51 % (ref 39.0–75.0)
Platelets: 148 10*3/uL (ref 140–400)
RBC: 5.22 10*6/uL (ref 4.20–5.82)
RDW: 13.7 % (ref 11.0–14.6)
RETIC %: 1.49 % (ref 0.80–1.80)
RETIC CT ABS: 77.78 10*3/uL (ref 34.80–93.90)
WBC: 3.2 10*3/uL — ABNORMAL LOW (ref 4.0–10.3)

## 2017-06-09 LAB — COMPREHENSIVE METABOLIC PANEL
ALT: 34 U/L (ref 0–55)
ANION GAP: 8 meq/L (ref 3–11)
AST: 28 U/L (ref 5–34)
Albumin: 3.9 g/dL (ref 3.5–5.0)
Alkaline Phosphatase: 56 U/L (ref 40–150)
BUN: 15.8 mg/dL (ref 7.0–26.0)
CHLORIDE: 103 meq/L (ref 98–109)
CO2: 28 meq/L (ref 22–29)
CREATININE: 1.3 mg/dL (ref 0.7–1.3)
Calcium: 9.4 mg/dL (ref 8.4–10.4)
Glucose: 141 mg/dl — ABNORMAL HIGH (ref 70–140)
Potassium: 4.1 mEq/L (ref 3.5–5.1)
SODIUM: 138 meq/L (ref 136–145)
Total Bilirubin: 0.82 mg/dL (ref 0.20–1.20)
Total Protein: 7.1 g/dL (ref 6.4–8.3)

## 2017-06-09 NOTE — Progress Notes (Signed)
Marland Kitchen    HEMATOLOGY/ONCOLOGY CLINIC NOTE  Date of Service: .01/06/2017  Patient Care Team: Hulan Fess, MD as PCP - General (Family Medicine)  CHIEF COMPLAINTS/PURPOSE OF CONSULTATION:  Thrombocytopenia and leukopenia  HISTORY OF PRESENTING ILLNESS:  Keith Bauer is a wonderful 66 y.o. male who has been referred to [place] by Dr Hulan Fess, MD for evaluation and management of thrombocytopenia and leukopenia.  Patient has a history of hypertension, dyslipidemia, impaired glucose tolerance, anxiety, erectile dysfunction, fatty liver noted on CT scan of the abdomen and pelvis in 2013, nephrolithiasis. He was referred for evaluation of his chronic leukopenia and thrombocytopenia. Based on review of his labs sent by his primary care physician his hemoglobin has been normal. His platelet counts have been low at least from February 2015 when they were 138k there were 148,000 on 08/22/2014 and recently were 125k on 09/05/2015.  His WBC counts have been around 2.5k with the highest number since 08/03/2013 of 2.8k on 03/15/2014. Noted to have neutropenia with an [place] fluctuating between [phone]. ANC was 1000 on 09/05/2015 with a total WBC count of 2.5k.  He reports no issues with frequent infections or uncontrolled infection. No issues with bleeding or easy bruisability. He had a hepatitis C test on 08/01/2014 which was negative. HIV testing on 08/01/2014 was nonreactive.  Patient notes no familial history of blood disorders, problems of infection.  He notes that he feels well. No fevers or chills. No unexpected weight loss. Good energy levels. No other acute new focal symptoms. Thus take a multivitamin daily. No other recent new medications..   INTERVAL HISTORY  Patient is here for follow-up of his idiopathic thrombocytopenia and leukopenia. He reports that he has been doing well in the interim. He does endorse some left sided hip pain which is worse with ambulation over the last month. He has been  seen by his PCP for this who attributed it to MSK pain. They will perform an XR in the near future. He notes that he has been feeling great with no issues with fatigue, infections, abnormal bleeding or bruising.  His platelet counts are within normal limits at 148k today. Hemoglobin is normal at 16.6. WBC counts slightly low at 3.2k with an ANC of 1.6k. No other acute focal symptoms. No bone pains. No constitutional symptoms. No note of enlarged lymph nodes.   MEDICAL HISTORY:  Past Medical History:  Diagnosis Date  . Anxiety   . Colon polyp   . Erectile dysfunction   . Fatty liver   . Hyperlipemia   . Hypertension   . Impaired glucose tolerance   . Low HDL (under 40)   . Nephrolithiasis    left kidney    SURGICAL HISTORY: Past Surgical History:  Procedure Laterality Date  . COLONOSCOPY  04, 08, 13    SOCIAL HISTORY: Social History   Socioeconomic History  . Marital status: Single    Spouse name: Not on file  . Number of children: 3  . Years of education: 12+  . Highest education level: Not on file  Social Needs  . Financial resource strain: Not on file  . Food insecurity - worry: Not on file  . Food insecurity - inability: Not on file  . Transportation needs - medical: Not on file  . Transportation needs - non-medical: Not on file  Occupational History  . Occupation: Educational psychologist    Comment: Customer service manager  Tobacco Use  . Smoking status: Never Smoker  . Smokeless tobacco: Never Used  Substance and Sexual Activity  . Alcohol use: Yes    Alcohol/week: 0.0 oz    Comment: 3-5 drinks per week  . Drug use: No  . Sexual activity: Not on file  Other Topics Concern  . Not on file  Social History Narrative   Lives at home alone.   Right-handed.   8-12 ounces caffeine per day.    FAMILY HISTORY: Family History  Problem Relation Age of Onset  . Cancer Mother        colon, rectal, ovarian  . Liver cancer Father   . Heart disease Brother   . Liver  disease Sister     ALLERGIES:  has No Known Allergies.  MEDICATIONS:  Current Outpatient Medications  Medication Sig Dispense Refill  . ALPRAZolam (XANAX) 0.5 MG tablet Take 0.25-0.5 mg by mouth daily as needed for anxiety.    Marland Kitchen aspirin 325 MG tablet Take 325 mg by mouth daily.      Marland Kitchen atorvastatin (LIPITOR) 10 MG tablet Take 10 mg by mouth daily.      Marland Kitchen losartan (COZAAR) 50 MG tablet Take 50 mg by mouth daily.    . Multiple Vitamins-Minerals (MULTIVITAMINS THER. W/MINERALS) TABS Take 1 tablet by mouth daily.      Marland Kitchen PARoxetine (PAXIL) 20 MG tablet TK 1 T PO QD IN THE MORNING  3  . sildenafil (VIAGRA) 100 MG tablet Take 100 mg by mouth daily as needed for erectile dysfunction.    . metoprolol tartrate (LOPRESSOR) 25 MG tablet Take 1 tablet (25 mg total) by mouth 2 (two) times daily as needed. 60 tablet 6   No current facility-administered medications for this visit.     REVIEW OF SYSTEMS:    10 Point review of Systems was done is negative except as noted above.  PHYSICAL EXAMINATION: ECOG PERFORMANCE STATUS: 0 - Asymptomatic  . Vitals:   06/09/17 1455  BP: (!) 166/77  Pulse: 88  Resp: 18  Temp: 98.3 F (36.8 C)  SpO2: 99%   Filed Weights   06/09/17 1455  Weight: 197 lb 1.6 oz (89.4 kg)   .Body mass index is 29.11 kg/m.  GENERAL:alert, in no acute distress and comfortable SKIN: skin color, texture, turgor are normal, no rashes or significant lesions EYES: normal, conjunctiva are pink and non-injected, sclera clear OROPHARYNX:no exudate, no erythema and lips, buccal mucosa, and tongue normal  NECK: supple, no JVD, thyroid normal size, non-tender, without nodularity LYMPH:  no palpable lymphadenopathy in the cervical, axillary or inguinal LUNGS: clear to auscultation with normal respiratory effort HEART: regular rate & rhythm,  no murmurs and no lower extremity edema ABDOMEN: abdomen soft, non-tender, normoactive bowel sounds , No palpable hepatosplenomegaly    Musculoskeletal: no cyanosis of digits and no clubbing  PSYCH: alert & oriented x 3 with fluent speech NEURO: no focal motor/sensory deficits  LABORATORY DATA:  I have reviewed the data as listed  . CBC Latest Ref Rng & Units 06/09/2017 01/06/2017 04/06/2016  WBC 4.0 - 10.3 10e3/uL 3.2(L) 2.6(L) 2.8(L)  Hemoglobin 13.0 - 17.1 g/dL 16.6 15.8 16.2  Hematocrit 38.4 - 49.9 % 47.9 46.6 46.8  Platelets 140 - 400 10e3/uL 148 148 129(L)   ANC 1000---> 1300-->1600 . CMP Latest Ref Rng & Units 06/09/2017 01/06/2017 04/06/2016  Glucose 70 - 140 mg/dl 141(H) 124 105  BUN 7.0 - 26.0 mg/dL 15.8 15.5 17.7  Creatinine 0.7 - 1.3 mg/dL 1.3 1.3 1.4(H)  Sodium 136 - 145 mEq/L 138 140 142  Potassium 3.5 -  5.1 mEq/L 4.1 4.2 4.2  Chloride 96 - 112 mEq/L - - -  CO2 22 - 29 mEq/L _0 Calcium 8.4 - 10.4 mg/dL 9.4 9.6 9.6  Total Protein 6.4 - 8.3 g/dL 7.1 7.2 7.3  Total Bilirubin 0.20 - 1.20 mg/dL 0.82 0.59 0.40  Alkaline Phos 40 - 150 U/L 56 61 64  AST 5 - 34 U/L _1 ALT 0 - 55 U/L 34 28 35     RADIOGRAPHIC STUDIES: I have personally reviewed the radiological images as listed and agreed with the findings in the report. No results found.  ASSESSMENT & PLAN:   66 year old African-American male with  #1 Chronic thrombocytopenia at least from June 2014. Without significant worsening. No issues with bleeding despite being on a full dose aspirin daily.  Denies drug abuse/alcohol abuse. No fevers/chills/night sweats/unexpected weight loss/lymphadenopathy/hepatosplenomegaly to suggest a lymphoproliferative disorder. No clinical stigmata of chronic autoimmune disease. Platelet counts have now normalized at 148k  #2 Mild leukopenia with neutropenia. No issues with frequent infections. This is also been at least since June 2014. 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 No evidence of overt autoimmune condition or specific medication exposures. Denies alcohol abuse or drug use. WBC have remained  stable in the 2.5k-3.5k range with stable ANC in in 1000-1300 range (1600 today) Plan -Lab results discussed in details with the patient. His overall blood counts are somewhat improved or stable and his clinical status is unchanged with no constitutional symptoms, bone pains. No issues with bleeding or infections. -No indication for G-CSF at this time. -No clear indications for bone marrow biopsy at this time but would consider this if his blood counts worsened. Patient is in agreement with this plan -would recommend Primary care physician consider an ultrasound of the abdomen to evaluate his liver again and spleen size to rule out splenomegaly as an etiology for his chronic stable cytopenias  especially since he has previously been found to have fatty liver on CT scan in 2013. -No problems with continuing aspirin at this time given his current platelet count. Would hold this if for whatever reason the platelet counts drop to under 75k. -Continue follow-up with primary care physician.  RTC with Dr Irene Limbo prn  All of the patients questions were answered  to his apparent satisfaction. The patient knows to call the clinic with any problems, questions or concerns.  I spent 15 minutes counseling the patient face to face. The total time spent in the appointment was 20 minutes and more than 50% was on counseling and direct patient cares.    Sullivan Lone MD [place] AAHIVMS [facility] Hematology/Oncology Physician [place]  (Office):       [phone] (Work cell):  [phone] (Fax):           [phone]  This document serves as a record of services personally performed by Sullivan Lone, MD. It was created on his behalf by Reola Mosher, a trained medical scribe. The creation of this record is based on the scribe's personal observations and the provider's statements to them.   .I have reviewed the above documentation for accuracy and completeness, and I agree with the above. Brunetta Genera MD MS

## 2017-06-10 ENCOUNTER — Telehealth: Payer: Self-pay | Admitting: Hematology

## 2017-06-10 DIAGNOSIS — G5702 Lesion of sciatic nerve, left lower limb: Secondary | ICD-10-CM | POA: Diagnosis not present

## 2017-06-10 NOTE — Telephone Encounter (Signed)
Per 12/20 los - RTC with Dr Irene Limbo prn

## 2017-07-01 DIAGNOSIS — M25552 Pain in left hip: Secondary | ICD-10-CM | POA: Diagnosis not present

## 2017-07-01 DIAGNOSIS — G5702 Lesion of sciatic nerve, left lower limb: Secondary | ICD-10-CM | POA: Diagnosis not present

## 2017-07-06 DIAGNOSIS — G5702 Lesion of sciatic nerve, left lower limb: Secondary | ICD-10-CM | POA: Diagnosis not present

## 2017-07-06 DIAGNOSIS — M25552 Pain in left hip: Secondary | ICD-10-CM | POA: Diagnosis not present

## 2017-07-08 DIAGNOSIS — M25552 Pain in left hip: Secondary | ICD-10-CM | POA: Diagnosis not present

## 2017-07-08 DIAGNOSIS — G5702 Lesion of sciatic nerve, left lower limb: Secondary | ICD-10-CM | POA: Diagnosis not present

## 2017-07-13 DIAGNOSIS — M25552 Pain in left hip: Secondary | ICD-10-CM | POA: Diagnosis not present

## 2017-07-13 DIAGNOSIS — G5702 Lesion of sciatic nerve, left lower limb: Secondary | ICD-10-CM | POA: Diagnosis not present

## 2017-07-15 DIAGNOSIS — M25552 Pain in left hip: Secondary | ICD-10-CM | POA: Diagnosis not present

## 2017-07-15 DIAGNOSIS — G5702 Lesion of sciatic nerve, left lower limb: Secondary | ICD-10-CM | POA: Diagnosis not present

## 2017-07-20 DIAGNOSIS — E663 Overweight: Secondary | ICD-10-CM | POA: Diagnosis not present

## 2017-07-20 DIAGNOSIS — K439 Ventral hernia without obstruction or gangrene: Secondary | ICD-10-CM | POA: Diagnosis not present

## 2017-07-20 DIAGNOSIS — M5416 Radiculopathy, lumbar region: Secondary | ICD-10-CM | POA: Diagnosis not present

## 2017-07-20 DIAGNOSIS — R69 Illness, unspecified: Secondary | ICD-10-CM | POA: Diagnosis not present

## 2017-07-25 DIAGNOSIS — M25552 Pain in left hip: Secondary | ICD-10-CM | POA: Diagnosis not present

## 2017-07-25 DIAGNOSIS — G5702 Lesion of sciatic nerve, left lower limb: Secondary | ICD-10-CM | POA: Diagnosis not present

## 2017-07-29 DIAGNOSIS — G5702 Lesion of sciatic nerve, left lower limb: Secondary | ICD-10-CM | POA: Diagnosis not present

## 2017-07-29 DIAGNOSIS — M25552 Pain in left hip: Secondary | ICD-10-CM | POA: Diagnosis not present

## 2017-08-10 DIAGNOSIS — R69 Illness, unspecified: Secondary | ICD-10-CM | POA: Diagnosis not present

## 2017-10-24 DIAGNOSIS — M25562 Pain in left knee: Secondary | ICD-10-CM | POA: Diagnosis not present

## 2017-11-02 DIAGNOSIS — N529 Male erectile dysfunction, unspecified: Secondary | ICD-10-CM | POA: Diagnosis not present

## 2017-11-02 DIAGNOSIS — E785 Hyperlipidemia, unspecified: Secondary | ICD-10-CM | POA: Diagnosis not present

## 2017-11-02 DIAGNOSIS — I10 Essential (primary) hypertension: Secondary | ICD-10-CM | POA: Diagnosis not present

## 2017-11-02 DIAGNOSIS — Z125 Encounter for screening for malignant neoplasm of prostate: Secondary | ICD-10-CM | POA: Diagnosis not present

## 2017-11-02 DIAGNOSIS — Z0000 Encounter for general adult medical examination without abnormal findings: Secondary | ICD-10-CM | POA: Diagnosis not present

## 2017-11-02 DIAGNOSIS — K760 Fatty (change of) liver, not elsewhere classified: Secondary | ICD-10-CM | POA: Diagnosis not present

## 2017-11-02 DIAGNOSIS — R69 Illness, unspecified: Secondary | ICD-10-CM | POA: Diagnosis not present

## 2017-11-02 DIAGNOSIS — R7301 Impaired fasting glucose: Secondary | ICD-10-CM | POA: Diagnosis not present

## 2017-11-02 DIAGNOSIS — D696 Thrombocytopenia, unspecified: Secondary | ICD-10-CM | POA: Diagnosis not present

## 2017-11-02 DIAGNOSIS — Z86010 Personal history of colonic polyps: Secondary | ICD-10-CM | POA: Diagnosis not present

## 2018-01-02 DIAGNOSIS — M25511 Pain in right shoulder: Secondary | ICD-10-CM | POA: Diagnosis not present

## 2018-01-02 DIAGNOSIS — M25562 Pain in left knee: Secondary | ICD-10-CM | POA: Diagnosis not present

## 2018-01-04 ENCOUNTER — Encounter

## 2018-01-04 ENCOUNTER — Institutional Professional Consult (permissible substitution): Payer: BLUE CROSS/BLUE SHIELD | Admitting: Neurology

## 2018-01-06 DIAGNOSIS — R079 Chest pain, unspecified: Secondary | ICD-10-CM | POA: Diagnosis not present

## 2018-01-09 DIAGNOSIS — R079 Chest pain, unspecified: Secondary | ICD-10-CM | POA: Diagnosis not present

## 2018-01-25 ENCOUNTER — Ambulatory Visit: Payer: Medicare HMO | Admitting: Neurology

## 2018-01-25 ENCOUNTER — Encounter: Payer: Self-pay | Admitting: Neurology

## 2018-01-25 VITALS — BP 125/70 | HR 69 | Ht 69.0 in | Wt 195.0 lb

## 2018-01-25 DIAGNOSIS — M79605 Pain in left leg: Secondary | ICD-10-CM | POA: Insufficient documentation

## 2018-01-25 NOTE — Progress Notes (Signed)
PATIENT: Keith Bauer DOB: 1951/06/17  Chief Complaint  Patient presents with  . Muscle Atrophy    Reports painful, burning sensation in his lower left leg with certain yoga poses, along with pain around his knee.  He saw an orthopaedic physician but his knee was okay.  Later, he devleoped left buttock pain, significant enough to cause him to stop his walking routine.  His buttock pain resolved with PT.    Marland Kitchen PCP    Rex Kras, Lennette Bihari, MD     HISTORICAL  Keith Bauer is a 67 years old male, seen in request by his primary care physician Dr. Hulan Fess for evaluation of muscle atrophy. Initial evaluation was on January 25 2018.  I reviewed and summarized referring note, he has PMHx of hypertension, hyperlipidemia, abnormal glucose level, thrombocytopenia, history of kidney stone  Laboratory evaluations on Nov 02, 2017 showed mild elevated LDL 112, normal TSH 1.9, normal CMP, creatinine of 1.06, CBC hemoglobin of 15.9, decreased WBC of 2.8,  Keith Bauer, seen in refer byHis primary care physician Dr. Hulan Fess for evaluation of recurrent episode of passing out, Initial evaluation was on September 17 2015  I reviewed and summarized the referring note, he had a history of hypertension, hyperlipidemia, chronic anxiety, impaired glucose intolerance, kidney stone, fatty liver  He reported recurrent episode of passing out, the most recent one was in September 2016, he just finished his 100 m competitions , while he was taking pictures with his friend, he suddenly felt lightheaded, heart racing fast, he was able to sit down, later he lost consciousness, vomiting, he was in and out of consciousness few times in a short period of time, there was no seizure activity noticed, he denies tongue biting, no urinary incontinence, he was taken to [facility], I was able to review laboratory evaluations, normal CMP with exception of elevated glucose 170, normal CBC with exception  of mildly low platelet 139, troponin was negative, mild elevated CPK 230  Over the past 6 years, since 2011, he had intermittent 8 episodes of passing out episode, usually preceded by heart racing fast, sweaty, lightheadedness sensation, he often has reaction time to find a secure spot to sit down, he never had self injury   He denies history of seizure, there was no seizure activity described, he does see a psychiatrist for anxiety related to driving through bridge, prolonged traveling in the car  Laboratory evaluation in September 05 2015, showed low WBC 2.5, hemoglobin 17 point 4 which was elevated, platelet 148 mildly decreased, normal CMP with exception of mild elevated glucose 103, creatinine was 1.25, with GFR 58, normal PSA 0.87, mild elevated LDL 102    Update Oct 30 2015:  I personally reviewed MRI brain without contrast that was normal, EEG was normal, echocardiogram was normal,  Update May 03 2016, He reported on episode in summer of 2017, he was walking in the park, felt lightheaded, he sat down at the bench, went to sleep, came around with out self injury, but it was very on usual to him to sleep in the park, 30 day cardiac monitoring in May 2017 was normal, patient reported that he did wear monitoring during that episode.   I have reviewed cardiac monitoring in May 2017, sinus rhythm, no arrhythmia detected  He no longer race, most of symptoms are gone,   UPDATE January 25 2018: He had no recurrent passing out spells, he came in today with few months history  of left buttock region deep achy pain, getting worse while ambulate, but denies significant low back pain, he was treated with a tapering dose of steroid in February 2019, his symptoms has much improved, he noticed mild left calf muscle weakness atrophy during the episode.  Now he regained most of the function, during his extreme left bending yoga pose, he had mild left lateral knee area paresthesia, improved after  straightening his leg  REVIEW OF SYSTEMS: Full 14 system review of systems performed and notable only for anxiety, snoring, ringing in ears,  ALLERGIES: No Known Allergies  HOME MEDICATIONS: Current Outpatient Medications  Medication Sig Dispense Refill  . ALPRAZolam (XANAX) 0.5 MG tablet Take 0.25-0.5 mg by mouth daily as needed for anxiety.    Marland Kitchen atorvastatin (LIPITOR) 10 MG tablet Take 10 mg by mouth daily.      Marland Kitchen losartan (COZAAR) 50 MG tablet Take 50 mg by mouth daily.    . Multiple Vitamins-Minerals (MULTIVITAMINS THER. W/MINERALS) TABS Take 1 tablet by mouth daily.      Marland Kitchen PARoxetine (PAXIL) 20 MG tablet TK 1 T PO QD IN THE MORNING  3  . sildenafil (VIAGRA) 100 MG tablet Take 100 mg by mouth daily as needed for erectile dysfunction.    . metoprolol tartrate (LOPRESSOR) 25 MG tablet Take 1 tablet (25 mg total) by mouth 2 (two) times daily as needed. 60 tablet 6   No current facility-administered medications for this visit.     PAST MEDICAL HISTORY: Past Medical History:  Diagnosis Date  . Anxiety   . Colon polyp   . Erectile dysfunction   . Fatty liver   . Hyperlipemia   . Hypertension   . Impaired glucose tolerance   . Left leg pain   . Low HDL (under 40)   . Nephrolithiasis    left kidney    PAST SURGICAL HISTORY: Past Surgical History:  Procedure Laterality Date  . COLONOSCOPY  04, 08, 13    FAMILY HISTORY: Family History  Problem Relation Age of Onset  . Cancer Mother        colon, rectal, ovarian  . Liver cancer Father   . Heart disease Brother   . Liver disease Sister     SOCIAL HISTORY: Social History   Socioeconomic History  . Marital status: Single    Spouse name: Not on file  . Number of children: 3  . Years of education: some college  . Highest education level: Not on file  Occupational History  . 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 Financial resource strain: Not on file  . Food  insecurity:    Worry: Not on file    Inability: Not on file  . Transportation needs:    Medical: Not on file    Non-medical: Not on file  Tobacco Use  . Smoking status: Never Smoker  . Smokeless tobacco: Never Used  Substance and Sexual Activity  . Alcohol use: Yes    Alcohol/week: 0.0 oz    Comment: 3-5 drinks per week  . Drug use: No  . Sexual activity: Not on file  Lifestyle  . Physical activity:    Days per week: Not on file    Minutes per session: Not on file  . Stress: Not on file  Relationships  . Social connections:    Talks on phone: Not on file    Gets together: Not on file    Attends religious  service: Not on file    Active member of club or organization: Not on file    Attends meetings of clubs or organizations: Not on file    Relationship status: Not on file  . Intimate partner violence:    Fear of current or ex partner: Not on file    Emotionally abused: Not on file    Physically abused: Not on file    Forced sexual activity: Not on file  Other Topics Concern  . Not on file  Social History Narrative   Lives at home with his family.   Right-handed.   8-12 ounces caffeine per day.     PHYSICAL EXAM   Vitals:   01/25/18 1036  BP: 125/70  Pulse: 69  Weight: 195 lb (88.5 kg)  Height: 5\' 9"  (1.753 m)    Not recorded      Body mass index is 28.8 kg/m.  PHYSICAL EXAMNIATION:  Gen: NAD, conversant, well nourised, obese, well groomed                     Cardiovascular: Regular rate rhythm, no peripheral edema, warm, nontender. Eyes: Conjunctivae clear without exudates or hemorrhage Neck: Supple, no carotid bruits. Pulmonary: Clear to auscultation bilaterally   NEUROLOGICAL EXAM:  MENTAL STATUS: Speech:    Speech is normal; fluent and spontaneous with normal comprehension.  Cognition:     Orientation to time, place and person     Normal recent and remote memory     Normal Attention span and concentration     Normal Language, naming,  repeating,spontaneous speech     Fund of knowledge   CRANIAL NERVES: CN II: Visual fields are full to confrontation. Fundoscopic exam is normal with sharp discs and no vascular changes. Pupils are round equal and briskly reactive to light. CN III, IV, VI: extraocular movement are normal. No ptosis. CN V: Facial sensation is intact to pinprick in all 3 divisions bilaterally. Corneal responses are intact.  CN VII: Face is symmetric with normal eye closure and smile. CN VIII: Hearing is normal to rubbing fingers CN IX, X: Palate elevates symmetrically. Phonation is normal. CN XI: Head turning and shoulder shrug are intact CN XII: Tongue is midline with normal movements and no atrophy.  MOTOR: There is no pronator drift of out-stretched arms. Muscle bulk and tone are normal. Muscle strength is normal.  REFLEXES: Reflexes are 2+ and symmetric at the biceps, triceps, knees, and ankles. Plantar responses are flexor.  SENSORY: Intact to light touch, pinprick, positional sensation and vibratory sensation are intact in fingers and toes.  COORDINATION: Rapid alternating movements and fine finger movements are intact. There is no dysmetria on finger-to-nose and heel-knee-shin.    GAIT/STANCE: Posture is normal. Gait is steady with normal steps, base, arm swing, and turning. Heel and toe walking are normal. Tandem gait is normal.  Romberg is absent.   DIAGNOSTIC DATA (LABS, IMAGING, TESTING) - I reviewed patient records, labs, notes, testing and imaging myself where available.   ASSESSMENT AND PLAN  Keith Bauer is a 67 y.o. male    New onset left buttock region deep achy pain, left lateral leg paresthesia  Mostly suggestive of left lumbar radiculopathy  Is much improved now,  Call clinic for worsening symptoms may consider MRI of lumbar then, NSAIDs, Tylenol as needed  Marcial Pacas, M.D. Ph.D.  [facility] [address], [place], [place] [address] Ph:  [phone] Fax: [phone]  CC:  Little,  Lennette Bihari, MD

## 2018-02-17 DIAGNOSIS — R69 Illness, unspecified: Secondary | ICD-10-CM | POA: Diagnosis not present

## 2018-04-10 DIAGNOSIS — R69 Illness, unspecified: Secondary | ICD-10-CM | POA: Diagnosis not present

## 2018-09-06 DIAGNOSIS — H2513 Age-related nuclear cataract, bilateral: Secondary | ICD-10-CM | POA: Diagnosis not present

## 2018-11-15 DIAGNOSIS — Z209 Contact with and (suspected) exposure to unspecified communicable disease: Secondary | ICD-10-CM | POA: Diagnosis not present

## 2018-11-15 DIAGNOSIS — I10 Essential (primary) hypertension: Secondary | ICD-10-CM | POA: Diagnosis not present

## 2018-11-15 DIAGNOSIS — S91311A Laceration without foreign body, right foot, initial encounter: Secondary | ICD-10-CM | POA: Diagnosis not present

## 2018-11-15 DIAGNOSIS — Z86010 Personal history of colonic polyps: Secondary | ICD-10-CM | POA: Diagnosis not present

## 2018-11-15 DIAGNOSIS — K760 Fatty (change of) liver, not elsewhere classified: Secondary | ICD-10-CM | POA: Diagnosis not present

## 2018-11-15 DIAGNOSIS — Z0000 Encounter for general adult medical examination without abnormal findings: Secondary | ICD-10-CM | POA: Diagnosis not present

## 2018-11-15 DIAGNOSIS — N529 Male erectile dysfunction, unspecified: Secondary | ICD-10-CM | POA: Diagnosis not present

## 2018-11-15 DIAGNOSIS — Z125 Encounter for screening for malignant neoplasm of prostate: Secondary | ICD-10-CM | POA: Diagnosis not present

## 2018-11-15 DIAGNOSIS — R7301 Impaired fasting glucose: Secondary | ICD-10-CM | POA: Diagnosis not present

## 2018-11-15 DIAGNOSIS — R69 Illness, unspecified: Secondary | ICD-10-CM | POA: Diagnosis not present

## 2018-11-15 DIAGNOSIS — E785 Hyperlipidemia, unspecified: Secondary | ICD-10-CM | POA: Diagnosis not present

## 2018-11-15 DIAGNOSIS — D72819 Decreased white blood cell count, unspecified: Secondary | ICD-10-CM | POA: Diagnosis not present

## 2018-11-23 ENCOUNTER — Other Ambulatory Visit: Payer: Self-pay | Admitting: Family Medicine

## 2018-11-23 DIAGNOSIS — D72819 Decreased white blood cell count, unspecified: Secondary | ICD-10-CM

## 2018-11-23 DIAGNOSIS — D696 Thrombocytopenia, unspecified: Secondary | ICD-10-CM

## 2018-11-29 DIAGNOSIS — E785 Hyperlipidemia, unspecified: Secondary | ICD-10-CM | POA: Diagnosis not present

## 2018-11-29 DIAGNOSIS — Z23 Encounter for immunization: Secondary | ICD-10-CM | POA: Diagnosis not present

## 2018-11-29 DIAGNOSIS — Z79899 Other long term (current) drug therapy: Secondary | ICD-10-CM | POA: Diagnosis not present

## 2018-11-29 DIAGNOSIS — D72819 Decreased white blood cell count, unspecified: Secondary | ICD-10-CM | POA: Diagnosis not present

## 2018-11-29 DIAGNOSIS — E1169 Type 2 diabetes mellitus with other specified complication: Secondary | ICD-10-CM | POA: Diagnosis not present

## 2018-12-06 ENCOUNTER — Ambulatory Visit
Admission: RE | Admit: 2018-12-06 | Discharge: 2018-12-06 | Disposition: A | Discharge status: Home / Self Care | Payer: Medicare HMO | Source: Ambulatory Visit | Attending: Family Medicine | Admitting: Family Medicine

## 2018-12-06 DIAGNOSIS — D72819 Decreased white blood cell count, unspecified: Secondary | ICD-10-CM

## 2018-12-06 DIAGNOSIS — D696 Thrombocytopenia, unspecified: Secondary | ICD-10-CM | POA: Diagnosis not present

## 2018-12-11 DIAGNOSIS — L237 Allergic contact dermatitis due to plants, except food: Secondary | ICD-10-CM | POA: Diagnosis not present

## 2019-02-02 DIAGNOSIS — I10 Essential (primary) hypertension: Secondary | ICD-10-CM | POA: Diagnosis not present

## 2019-02-02 DIAGNOSIS — E785 Hyperlipidemia, unspecified: Secondary | ICD-10-CM | POA: Diagnosis not present

## 2019-02-02 DIAGNOSIS — E1169 Type 2 diabetes mellitus with other specified complication: Secondary | ICD-10-CM | POA: Diagnosis not present

## 2019-03-02 DIAGNOSIS — E785 Hyperlipidemia, unspecified: Secondary | ICD-10-CM | POA: Diagnosis not present

## 2019-03-02 DIAGNOSIS — D72819 Decreased white blood cell count, unspecified: Secondary | ICD-10-CM | POA: Diagnosis not present

## 2019-03-02 DIAGNOSIS — E1169 Type 2 diabetes mellitus with other specified complication: Secondary | ICD-10-CM | POA: Diagnosis not present

## 2019-03-02 DIAGNOSIS — Z79899 Other long term (current) drug therapy: Secondary | ICD-10-CM | POA: Diagnosis not present

## 2019-03-05 DIAGNOSIS — E785 Hyperlipidemia, unspecified: Secondary | ICD-10-CM | POA: Diagnosis not present

## 2019-03-05 DIAGNOSIS — I10 Essential (primary) hypertension: Secondary | ICD-10-CM | POA: Diagnosis not present

## 2019-03-05 DIAGNOSIS — E1169 Type 2 diabetes mellitus with other specified complication: Secondary | ICD-10-CM | POA: Diagnosis not present

## 2019-03-22 DIAGNOSIS — Z23 Encounter for immunization: Secondary | ICD-10-CM | POA: Diagnosis not present

## 2019-04-11 DIAGNOSIS — E1169 Type 2 diabetes mellitus with other specified complication: Secondary | ICD-10-CM | POA: Diagnosis not present

## 2019-04-11 DIAGNOSIS — I10 Essential (primary) hypertension: Secondary | ICD-10-CM | POA: Diagnosis not present

## 2019-04-11 DIAGNOSIS — E785 Hyperlipidemia, unspecified: Secondary | ICD-10-CM | POA: Diagnosis not present

## 2019-05-02 DIAGNOSIS — R69 Illness, unspecified: Secondary | ICD-10-CM | POA: Diagnosis not present

## 2019-05-03 DIAGNOSIS — I10 Essential (primary) hypertension: Secondary | ICD-10-CM | POA: Diagnosis not present

## 2019-05-03 DIAGNOSIS — E785 Hyperlipidemia, unspecified: Secondary | ICD-10-CM | POA: Diagnosis not present

## 2019-05-03 DIAGNOSIS — E1169 Type 2 diabetes mellitus with other specified complication: Secondary | ICD-10-CM | POA: Diagnosis not present

## 2019-07-10 DIAGNOSIS — R69 Illness, unspecified: Secondary | ICD-10-CM | POA: Diagnosis not present

## 2019-07-18 ENCOUNTER — Telehealth: Payer: Self-pay | Admitting: Hematology

## 2019-07-18 NOTE — Telephone Encounter (Signed)
Scheduled appt per 12/2 sch msg - pt aware of appt date and time   

## 2019-07-30 ENCOUNTER — Ambulatory Visit: Payer: Medicare HMO | Attending: Internal Medicine

## 2019-07-30 DIAGNOSIS — Z23 Encounter for immunization: Secondary | ICD-10-CM

## 2019-07-30 NOTE — Progress Notes (Signed)
   Covid-19 Vaccination Clinic  Name:  Keith Bauer    MRN: [id number] DOB: 03/25/1951  07/30/2019  [name] was observed post Covid-19 immunization for 15 minutes without incidence. He was provided with Vaccine Information Sheet and instruction to access the V-Safe system.   [name] was instructed to call 911 with any severe reactions post vaccine: Marland Kitchen Difficulty breathing  . Swelling of your face and throat  . A fast heartbeat  . A bad rash all over your body  . Dizziness and weakness    Immunizations Administered    Name Date Dose VIS Date Route   Pfizer COVID-19 Vaccine 07/30/2019  5:41 PM 0.3 mL 06/01/2019 Intramuscular   Manufacturer: [employer]   Lot: [id number]   [place]: [id number]

## 2019-08-15 DIAGNOSIS — E785 Hyperlipidemia, unspecified: Secondary | ICD-10-CM | POA: Diagnosis not present

## 2019-08-15 DIAGNOSIS — E1169 Type 2 diabetes mellitus with other specified complication: Secondary | ICD-10-CM | POA: Diagnosis not present

## 2019-08-15 DIAGNOSIS — I10 Essential (primary) hypertension: Secondary | ICD-10-CM | POA: Diagnosis not present

## 2019-08-16 ENCOUNTER — Telehealth: Payer: Self-pay | Admitting: Hematology

## 2019-08-16 ENCOUNTER — Other Ambulatory Visit: Payer: Self-pay

## 2019-08-16 ENCOUNTER — Inpatient Hospital Stay: Payer: Medicare HMO

## 2019-08-16 ENCOUNTER — Inpatient Hospital Stay: Payer: Medicare HMO | Attending: Hematology | Admitting: Hematology

## 2019-08-16 VITALS — BP 122/79 | HR 70 | Temp 97.4°F | Resp 18 | Ht 69.0 in | Wt 199.9 lb

## 2019-08-16 DIAGNOSIS — D72819 Decreased white blood cell count, unspecified: Secondary | ICD-10-CM | POA: Diagnosis not present

## 2019-08-16 DIAGNOSIS — F419 Anxiety disorder, unspecified: Secondary | ICD-10-CM | POA: Insufficient documentation

## 2019-08-16 DIAGNOSIS — D696 Thrombocytopenia, unspecified: Secondary | ICD-10-CM

## 2019-08-16 DIAGNOSIS — I10 Essential (primary) hypertension: Secondary | ICD-10-CM | POA: Insufficient documentation

## 2019-08-16 DIAGNOSIS — D709 Neutropenia, unspecified: Secondary | ICD-10-CM

## 2019-08-16 DIAGNOSIS — E785 Hyperlipidemia, unspecified: Secondary | ICD-10-CM | POA: Diagnosis not present

## 2019-08-16 DIAGNOSIS — R69 Illness, unspecified: Secondary | ICD-10-CM | POA: Diagnosis not present

## 2019-08-16 DIAGNOSIS — Z86010 Personal history of colonic polyps: Secondary | ICD-10-CM | POA: Insufficient documentation

## 2019-08-16 DIAGNOSIS — K760 Fatty (change of) liver, not elsewhere classified: Secondary | ICD-10-CM | POA: Insufficient documentation

## 2019-08-16 DIAGNOSIS — N529 Male erectile dysfunction, unspecified: Secondary | ICD-10-CM | POA: Insufficient documentation

## 2019-08-16 DIAGNOSIS — Z79899 Other long term (current) drug therapy: Secondary | ICD-10-CM | POA: Insufficient documentation

## 2019-08-16 LAB — CBC WITH DIFFERENTIAL/PLATELET
Abs Immature Granulocytes: 0.01 10*3/uL (ref 0.00–0.07)
Basophils Absolute: 0 10*3/uL (ref 0.0–0.1)
Basophils Relative: 1 %
Eosinophils Absolute: 0.1 10*3/uL (ref 0.0–0.5)
Eosinophils Relative: 2 %
HCT: 49.4 % (ref 39.0–52.0)
Hemoglobin: 16.8 g/dL (ref 13.0–17.0)
Immature Granulocytes: 0 %
Lymphocytes Relative: 38 %
Lymphs Abs: 0.9 10*3/uL (ref 0.7–4.0)
MCH: 30.9 pg (ref 26.0–34.0)
MCHC: 34 g/dL (ref 30.0–36.0)
MCV: 90.8 fL (ref 80.0–100.0)
Monocytes Absolute: 0.3 10*3/uL (ref 0.1–1.0)
Monocytes Relative: 13 %
Neutro Abs: 1.1 10*3/uL — ABNORMAL LOW (ref 1.7–7.7)
Neutrophils Relative %: 46 %
Platelets: 158 10*3/uL (ref 150–400)
RBC: 5.44 MIL/uL (ref 4.22–5.81)
RDW: 13.1 % (ref 11.5–15.5)
WBC: 2.3 10*3/uL — ABNORMAL LOW (ref 4.0–10.5)
nRBC: 0 % (ref 0.0–0.2)

## 2019-08-16 LAB — CMP (CANCER CENTER ONLY)
ALT: 36 U/L (ref 0–44)
AST: 26 U/L (ref 15–41)
Albumin: 4.1 g/dL (ref 3.5–5.0)
Alkaline Phosphatase: 61 U/L (ref 38–126)
Anion gap: 8 (ref 5–15)
BUN: 12 mg/dL (ref 8–23)
CO2: 29 mmol/L (ref 22–32)
Calcium: 9.6 mg/dL (ref 8.9–10.3)
Chloride: 105 mmol/L (ref 98–111)
Creatinine: 1.27 mg/dL — ABNORMAL HIGH (ref 0.61–1.24)
GFR, Est AFR Am: >60 mL/min (ref >60)
GFR, Estimated: 57 mL/min — ABNORMAL LOW (ref >60)
Glucose, Bld: 128 mg/dL — ABNORMAL HIGH (ref 70–99)
Potassium: 4.4 mmol/L (ref 3.5–5.1)
Sodium: 142 mmol/L (ref 135–145)
Total Bilirubin: 0.9 mg/dL (ref 0.3–1.2)
Total Protein: 7.5 g/dL (ref 6.5–8.1)

## 2019-08-16 LAB — VITAMIN B12: Vitamin B-12: 724 pg/mL (ref 180–914)

## 2019-08-16 LAB — LACTATE DEHYDROGENASE: LDH: 139 U/L (ref 98–192)

## 2019-08-16 NOTE — Progress Notes (Signed)
Marland Kitchen    HEMATOLOGY/ONCOLOGY CLINIC NOTE  Date of Service: .01/06/2017  Patient Care Team: Hulan Fess, MD as PCP - General (Family Medicine)  CHIEF COMPLAINTS/PURPOSE OF CONSULTATION:  Thrombocytopenia and leukopenia  HISTORY OF PRESENTING ILLNESS:  Keith Bauer is a wonderful 69 y.o. male who has been referred to [place] by Dr Hulan Fess, MD for evaluation and management of thrombocytopenia and leukopenia.  Patient has a history of hypertension, dyslipidemia, impaired glucose tolerance, anxiety, erectile dysfunction, fatty liver noted on CT scan of the abdomen and pelvis in 2013, nephrolithiasis. He was referred for evaluation of his chronic leukopenia and thrombocytopenia. Based on review of his labs sent by his primary care physician his hemoglobin has been normal. His platelet counts have been low at least from February 2015 when they were 138k there were 148,000 on 08/22/2014 and recently were 125k on 09/05/2015.  His WBC counts have been around 2.5k with the highest number since 08/03/2013 of 2.8k on 03/15/2014. Noted to have neutropenia with an [place] fluctuating between [phone]. ANC was 1000 on 09/05/2015 with a total WBC count of 2.5k.  He reports no issues with frequent infections or uncontrolled infection. No issues with bleeding or easy bruisability. He had a hepatitis C test on 08/01/2014 which was negative. HIV testing on 08/01/2014 was nonreactive.  Patient notes no familial history of blood disorders, problems of infection.  He notes that he feels well. No fevers or chills. No unexpected weight loss. Good energy levels. No other acute new focal symptoms. Thus take a multivitamin daily. No other recent new medications..   INTERVAL HISTORY  Keith Bauer is here for follow-up of his idiopathic thrombocytopenia and leukopenia. The patient's last visit with [place] was on 06/09/2017. The pt reports that he is doing well overall.  The pt reports that he has not felt differently  in the last 6-12 months. He has stopped taking Metoprolol, but has had no other mediation changes. Pt takes OTC Ibuprofen rarely for headaches. Pt denies any cough, cold, or other respiratory symptoms prior to his last labs. He received his annual flu vaccine in September/October. Pt has not had concerns for chronic infections. He is currently drinking about one alcoholic beverage per week. He has continued to eat, sleep, walk and do yoga as normal.   Lab results today (03/02/2019) of CBC w/diff is as follows: all values are WNL except for WBC at 2.3K, PLT at 133K, Neutro Rel at 35.9, Lymphs Rel at 48.6, Baso Rel at 1.1, Neutro Rel at 0.8K.  On review of systems, pt reports healthy appetite and denies fevers, chills, night sweats, unexpected weight loss, bone pain, new lumps/bumps and any other symptoms.   MEDICAL HISTORY:  Past Medical History:  Diagnosis Date  . Anxiety   . Colon polyp   . Erectile dysfunction   . Fatty liver   . Hyperlipemia   . Hypertension   . Impaired glucose tolerance   . Left leg pain   . Low HDL (under 40)   . Nephrolithiasis    left kidney    SURGICAL HISTORY: Past Surgical History:  Procedure Laterality Date  . COLONOSCOPY  04, 08, 13    SOCIAL HISTORY: Social History   Socioeconomic History  . Marital status: Single    Spouse name: Not on file  . Number of children: 3  . Years of education: some college  . Highest education level: Not on file  Occupational History  . Occupation: Educational psychologist - retired  Comment: [employer]  Tobacco Use  . Smoking status: Never Smoker  . Smokeless tobacco: Never Used  Substance and Sexual Activity  . Alcohol use: Yes    Alcohol/week: 0.0 standard drinks    Comment: 3-5 drinks per week  . Drug use: No  . Sexual activity: Not on file  Other Topics Concern  . Not on file  Social History Narrative   Lives at home with his family.   Right-handed.   8-12 ounces caffeine per day.   Social  Determinants of Health   Financial Resource Strain:   . Difficulty of Paying Living Expenses: Not on file  Food Insecurity:   . Worried About Charity fundraiser in the Last Year: Not on file  . Ran Out of Food in the Last Year: Not on file  Transportation Needs:   . Lack of Transportation (Medical): Not on file  . Lack of Transportation (Non-Medical): Not on file  Physical Activity:   . Days of Exercise per Week: Not on file  . Minutes of Exercise per Session: Not on file  Stress:   . Feeling of Stress : Not on file  Social Connections:   . Frequency of Communication with Friends and Family: Not on file  . Frequency of Social Gatherings with Friends and Family: Not on file  . Attends Religious Services: Not on file  . Active Member of Clubs or Organizations: Not on file  . Attends Archivist Meetings: Not on file  . Marital Status: Not on file  Intimate Partner Violence:   . Fear of Current or Ex-Partner: Not on file  . Emotionally Abused: Not on file  . Physically Abused: Not on file  . Sexually Abused: Not on file    FAMILY HISTORY: Family History  Problem Relation Age of Onset  . Cancer Mother        colon, rectal, ovarian  . Liver cancer Father   . Heart disease Brother   . Liver disease Sister     ALLERGIES:  has No Known Allergies.  MEDICATIONS:  Current Outpatient Medications  Medication Sig Dispense Refill  . ALPRAZolam (XANAX) 0.5 MG tablet Take 0.25-0.5 mg by mouth daily as needed for anxiety.    Marland Kitchen atorvastatin (LIPITOR) 10 MG tablet Take 10 mg by mouth daily.      Marland Kitchen losartan (COZAAR) 50 MG tablet Take 50 mg by mouth daily.    . metoprolol tartrate (LOPRESSOR) 25 MG tablet Take 1 tablet (25 mg total) by mouth 2 (two) times daily as needed. 60 tablet 6  . Multiple Vitamins-Minerals (MULTIVITAMINS THER. W/MINERALS) TABS Take 1 tablet by mouth daily.      Marland Kitchen PARoxetine (PAXIL) 20 MG tablet TK 1 T PO QD IN THE MORNING  3  . sildenafil (VIAGRA) 100 MG  tablet Take 100 mg by mouth daily as needed for erectile dysfunction.     No current facility-administered medications for this visit.    REVIEW OF SYSTEMS:   A 10+ POINT REVIEW OF SYSTEMS WAS OBTAINED including neurology, dermatology, psychiatry, cardiac, respiratory, lymph, extremities, GI, GU, Musculoskeletal, constitutional, breasts, reproductive, HEENT.  All pertinent positives are noted in the HPI.  All others are negative.   PHYSICAL EXAMINATION: ECOG PERFORMANCE STATUS: 0 - Asymptomatic  . Vitals:   08/16/19 0959  BP: 122/79  Pulse: 70  Resp: 18  Temp: (!) 97.4 F (36.3 C)  SpO2: 100%   Filed Weights   08/16/19 0959  Weight: 199 lb 14.4  oz (90.7 kg)   .Body mass index is 29.52 kg/m.   GENERAL:alert, in no acute distress and comfortable SKIN: no acute rashes, no significant lesions EYES: conjunctiva are pink and non-injected, sclera anicteric OROPHARYNX: MMM, no exudates, no oropharyngeal erythema or ulceration NECK: supple, no JVD LYMPH:  no palpable lymphadenopathy in the cervical, axillary or inguinal regions LUNGS: clear to auscultation b/l with normal respiratory effort HEART: regular rate & rhythm ABDOMEN:  normoactive bowel sounds , non tender, not distended. No palpable hepatosplenomegaly.  Extremity: no pedal edema PSYCH: alert & oriented x 3 with fluent speech NEURO: no focal motor/sensory deficits  LABORATORY DATA:  I have reviewed the data as listed  . CBC Latest Ref Rng & Units 08/16/2019 08/16/2019 06/09/2017  WBC 4.0 - 10.5 K/uL 2.3(L) - 3.2(L)  Hemoglobin 13.0 - 17.0 g/dL 16.8 - 16.6  Hematocrit 39.0 - 52.0 % 49.4 49.6 47.9  Platelets 150 - 400 K/uL 158 - 148   ANC 1000---> 1300-->1600--->1100 . CMP Latest Ref Rng & Units 08/16/2019 06/09/2017 01/06/2017  Glucose 70 - 99 mg/dL 128(H) 141(H) 124  BUN 8 - 23 mg/dL 12 15.8 15.5  Creatinine 0.61 - 1.24 mg/dL 1.27(H) 1.3 1.3  Sodium 135 - 145 mmol/L 142 138 140  Potassium 3.5 - 5.1 mmol/L 4.4  4.1 4.2  Chloride 98 - 111 mmol/L 105 - -  CO2 22 - 32 mmol/L 29 28 29   Calcium 8.9 - 10.3 mg/dL 9.6 9.4 9.6  Total Protein 6.5 - 8.1 g/dL 7.5 7.1 7.2  Total Bilirubin 0.3 - 1.2 mg/dL 0.9 0.82 0.59  Alkaline Phos 38 - 126 U/L 61 56 61  AST 15 - 41 U/L 26 28 21   ALT 0 - 44 U/L 36 34 28     RADIOGRAPHIC STUDIES: I have personally reviewed the radiological images as listed and agreed with the findings in the report. No results found.  ASSESSMENT & PLAN:   69 year old African-American male with  #1 Chronic thrombocytopenia at least from June 2014. Without significant worsening. No issues with bleeding despite being on a full dose aspirin daily.  Denies drug abuse/alcohol abuse. No fevers/chills/night sweats/unexpected weight loss/lymphadenopathy/hepatosplenomegaly to suggest a lymphoproliferative disorder. No clinical stigmata of chronic autoimmune disease. Platelet counts have now normalized at 148k  #2 Mild leukopenia with neutropenia. No issues with frequent infections. This is also been at least since June 2014. 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 No evidence of overt autoimmune condition or specific medication exposures. Denies alcohol abuse or drug use. WBC have remained stable in the 2.5k-3.5k range with stable ANC in in 1000-1300 range (1100 today) Plan: -Discussed pt labwork today, 08/16/19; WBC are low, PLT are low normal, other blood counts are stable  -His clinical status is unchanged with no constitutional symptoms, bone pains. No issues with bleeding or infections. -Advised pt that his WBC and PLT are minimally low and have varied in the past. Could be some element of Benign Ethnic Neutropenia. -No lymphadenopathy or splenomegaly on clinical exam  -Advised pt that using OTC pain medications chronically can lower PLT counts - pt does not appear to have done this recently  -Advised pt that certain weight loss supplements can affect blood counts  -Will get labs today    -Will see back in 6 months with labs  -Continue follow-up with primary care physician -Advised pt to contact if any concerns or changes in symptomolgoy     FOLLOW UP: Labs today RTC with Dr Irene Limbo with labs in 6 months  The total time spent in the appt was 15 minutes and more than 50% was on counseling and direct patient cares.  All of the patient's questions were answered with apparent satisfaction. The patient knows to call the clinic with any problems, questions or concerns.  Sullivan Lone MD [place] AAHIVMS [facility] Hematology/Oncology Physician [facility]  (Office):       [phone] (Work cell):  [phone] (Fax):           [phone]  I, Yevette Edwards, am acting as a scribe for Dr. Sullivan Lone.   .I have reviewed the above documentation for accuracy and completeness, and I agree with the above. Brunetta Genera MD    ADDENDUM  Component     Latest Ref Rng & Units 08/16/2019  Folate, Hemolysate     Not Estab. ng/mL 465.0  HCT     37.5 - 51.0 % 49.6  Folate, RBC     >498 ng/mL 938  Methylmalonic Acid, Quantitative     0 - 378 nmol/L 144  DISCLAIMER      Comment  Copper     69 - 132 ug/dL 91  Vitamin B12     180 - 914 pg/mL 724  LDH     98 - 192 U/L 139   Surgical Pathology  CASE: [id number]  PATIENT: Keith Bauer  Flow Pathology Report   Clinical history: neutropenia/thrombocytopenia   DIAGNOSIS:   - No monoclonal B-cell population identified.  - Predominance of T-cells with non-specific changes.

## 2019-08-16 NOTE — Telephone Encounter (Signed)
Scheduled appt per 2/25 los - gave patient AVS

## 2019-08-17 LAB — FOLATE RBC
Folate, Hemolysate: 465 ng/mL
Folate, RBC: 938 ng/mL (ref >498)
Hematocrit: 49.6 % (ref 37.5–51.0)

## 2019-08-17 LAB — SURGICAL PATHOLOGY

## 2019-08-19 LAB — METHYLMALONIC ACID, SERUM: Methylmalonic Acid, Quantitative: 144 nmol/L (ref 0–378)

## 2019-08-19 LAB — COPPER, SERUM: Copper: 91 ug/dL (ref 69–132)

## 2019-08-20 ENCOUNTER — Ambulatory Visit: Payer: Medicare HMO | Attending: Internal Medicine

## 2019-08-20 DIAGNOSIS — Z23 Encounter for immunization: Secondary | ICD-10-CM | POA: Insufficient documentation

## 2019-08-20 NOTE — Progress Notes (Signed)
   Covid-19 Vaccination Clinic  Name:  Keith Bauer    MRN: [id number] DOB: [birth year]  08/20/2019  Mr. Shaddy was observed post Covid-19 immunization for 15 minutes without incidence. He was provided with Vaccine Information Sheet and instruction to access the V-Safe system.   Mr. Kaupp was instructed to call 911 with any severe reactions post vaccine: Marland Kitchen Difficulty breathing  . Swelling of your face and throat  . A fast heartbeat  . A bad rash all over your body  . Dizziness and weakness    Immunizations Administered    Name Date Dose VIS Date Route   Pfizer COVID-19 Vaccine 08/20/2019  4:54 PM 0.3 mL 06/01/2019 Intramuscular   Manufacturer: [place]   Lot: [id number]   [place]: [id number]

## 2019-08-21 LAB — FLOW CYTOMETRY

## 2019-08-30 DIAGNOSIS — D696 Thrombocytopenia, unspecified: Secondary | ICD-10-CM | POA: Diagnosis not present

## 2019-08-30 DIAGNOSIS — D72819 Decreased white blood cell count, unspecified: Secondary | ICD-10-CM | POA: Diagnosis not present

## 2019-08-30 DIAGNOSIS — E1169 Type 2 diabetes mellitus with other specified complication: Secondary | ICD-10-CM | POA: Diagnosis not present

## 2019-09-03 DIAGNOSIS — R69 Illness, unspecified: Secondary | ICD-10-CM | POA: Diagnosis not present

## 2019-09-11 DIAGNOSIS — E785 Hyperlipidemia, unspecified: Secondary | ICD-10-CM | POA: Diagnosis not present

## 2019-09-11 DIAGNOSIS — E1169 Type 2 diabetes mellitus with other specified complication: Secondary | ICD-10-CM | POA: Diagnosis not present

## 2019-09-11 DIAGNOSIS — I10 Essential (primary) hypertension: Secondary | ICD-10-CM | POA: Diagnosis not present

## 2019-10-03 DIAGNOSIS — E785 Hyperlipidemia, unspecified: Secondary | ICD-10-CM | POA: Diagnosis not present

## 2019-10-03 DIAGNOSIS — I10 Essential (primary) hypertension: Secondary | ICD-10-CM | POA: Diagnosis not present

## 2019-10-03 DIAGNOSIS — E1169 Type 2 diabetes mellitus with other specified complication: Secondary | ICD-10-CM | POA: Diagnosis not present

## 2019-11-08 DIAGNOSIS — E1169 Type 2 diabetes mellitus with other specified complication: Secondary | ICD-10-CM | POA: Diagnosis not present

## 2019-11-08 DIAGNOSIS — I10 Essential (primary) hypertension: Secondary | ICD-10-CM | POA: Diagnosis not present

## 2019-11-08 DIAGNOSIS — E785 Hyperlipidemia, unspecified: Secondary | ICD-10-CM | POA: Diagnosis not present

## 2019-11-21 DIAGNOSIS — Z1159 Encounter for screening for other viral diseases: Secondary | ICD-10-CM | POA: Diagnosis not present

## 2019-11-21 DIAGNOSIS — Z0000 Encounter for general adult medical examination without abnormal findings: Secondary | ICD-10-CM | POA: Diagnosis not present

## 2019-11-21 DIAGNOSIS — Z86010 Personal history of colonic polyps: Secondary | ICD-10-CM | POA: Diagnosis not present

## 2019-11-21 DIAGNOSIS — D696 Thrombocytopenia, unspecified: Secondary | ICD-10-CM | POA: Diagnosis not present

## 2019-11-21 DIAGNOSIS — I10 Essential (primary) hypertension: Secondary | ICD-10-CM | POA: Diagnosis not present

## 2019-11-21 DIAGNOSIS — Z125 Encounter for screening for malignant neoplasm of prostate: Secondary | ICD-10-CM | POA: Diagnosis not present

## 2019-11-21 DIAGNOSIS — R69 Illness, unspecified: Secondary | ICD-10-CM | POA: Diagnosis not present

## 2019-11-21 DIAGNOSIS — E1169 Type 2 diabetes mellitus with other specified complication: Secondary | ICD-10-CM | POA: Diagnosis not present

## 2019-11-21 DIAGNOSIS — N529 Male erectile dysfunction, unspecified: Secondary | ICD-10-CM | POA: Diagnosis not present

## 2019-11-21 DIAGNOSIS — E785 Hyperlipidemia, unspecified: Secondary | ICD-10-CM | POA: Diagnosis not present

## 2019-12-05 DIAGNOSIS — E1169 Type 2 diabetes mellitus with other specified complication: Secondary | ICD-10-CM | POA: Diagnosis not present

## 2019-12-05 DIAGNOSIS — R69 Illness, unspecified: Secondary | ICD-10-CM | POA: Diagnosis not present

## 2019-12-05 DIAGNOSIS — Z0000 Encounter for general adult medical examination without abnormal findings: Secondary | ICD-10-CM | POA: Diagnosis not present

## 2019-12-05 DIAGNOSIS — D696 Thrombocytopenia, unspecified: Secondary | ICD-10-CM | POA: Diagnosis not present

## 2019-12-05 DIAGNOSIS — N529 Male erectile dysfunction, unspecified: Secondary | ICD-10-CM | POA: Diagnosis not present

## 2019-12-05 DIAGNOSIS — Z125 Encounter for screening for malignant neoplasm of prostate: Secondary | ICD-10-CM | POA: Diagnosis not present

## 2019-12-05 DIAGNOSIS — Z1159 Encounter for screening for other viral diseases: Secondary | ICD-10-CM | POA: Diagnosis not present

## 2019-12-05 DIAGNOSIS — Z86010 Personal history of colonic polyps: Secondary | ICD-10-CM | POA: Diagnosis not present

## 2019-12-05 DIAGNOSIS — I10 Essential (primary) hypertension: Secondary | ICD-10-CM | POA: Diagnosis not present

## 2019-12-05 DIAGNOSIS — E785 Hyperlipidemia, unspecified: Secondary | ICD-10-CM | POA: Diagnosis not present

## 2019-12-06 DIAGNOSIS — E1169 Type 2 diabetes mellitus with other specified complication: Secondary | ICD-10-CM | POA: Diagnosis not present

## 2019-12-06 DIAGNOSIS — E785 Hyperlipidemia, unspecified: Secondary | ICD-10-CM | POA: Diagnosis not present

## 2019-12-06 DIAGNOSIS — I10 Essential (primary) hypertension: Secondary | ICD-10-CM | POA: Diagnosis not present

## 2019-12-20 DIAGNOSIS — H40013 Open angle with borderline findings, low risk, bilateral: Secondary | ICD-10-CM | POA: Diagnosis not present

## 2020-01-04 DIAGNOSIS — E785 Hyperlipidemia, unspecified: Secondary | ICD-10-CM | POA: Diagnosis not present

## 2020-01-04 DIAGNOSIS — E1169 Type 2 diabetes mellitus with other specified complication: Secondary | ICD-10-CM | POA: Diagnosis not present

## 2020-01-04 DIAGNOSIS — I10 Essential (primary) hypertension: Secondary | ICD-10-CM | POA: Diagnosis not present

## 2020-02-12 ENCOUNTER — Other Ambulatory Visit: Payer: Self-pay | Admitting: *Deleted

## 2020-02-12 DIAGNOSIS — D696 Thrombocytopenia, unspecified: Secondary | ICD-10-CM

## 2020-02-12 DIAGNOSIS — E785 Hyperlipidemia, unspecified: Secondary | ICD-10-CM | POA: Diagnosis not present

## 2020-02-12 DIAGNOSIS — E1169 Type 2 diabetes mellitus with other specified complication: Secondary | ICD-10-CM | POA: Diagnosis not present

## 2020-02-12 DIAGNOSIS — I10 Essential (primary) hypertension: Secondary | ICD-10-CM | POA: Diagnosis not present

## 2020-02-13 ENCOUNTER — Inpatient Hospital Stay: Payer: Medicare HMO | Attending: Hematology | Admitting: Hematology

## 2020-02-13 ENCOUNTER — Inpatient Hospital Stay: Payer: Medicare HMO

## 2020-02-13 ENCOUNTER — Other Ambulatory Visit: Payer: Self-pay

## 2020-02-13 VITALS — BP 132/77 | HR 63 | Temp 96.7°F | Resp 18 | Ht 69.0 in | Wt 195.3 lb

## 2020-02-13 DIAGNOSIS — D72819 Decreased white blood cell count, unspecified: Secondary | ICD-10-CM | POA: Diagnosis not present

## 2020-02-13 DIAGNOSIS — Z87442 Personal history of urinary calculi: Secondary | ICD-10-CM | POA: Diagnosis not present

## 2020-02-13 DIAGNOSIS — Z800 Family history of malignant neoplasm of digestive organs: Secondary | ICD-10-CM | POA: Diagnosis not present

## 2020-02-13 DIAGNOSIS — E785 Hyperlipidemia, unspecified: Secondary | ICD-10-CM | POA: Insufficient documentation

## 2020-02-13 DIAGNOSIS — D696 Thrombocytopenia, unspecified: Secondary | ICD-10-CM | POA: Insufficient documentation

## 2020-02-13 DIAGNOSIS — N529 Male erectile dysfunction, unspecified: Secondary | ICD-10-CM | POA: Diagnosis not present

## 2020-02-13 DIAGNOSIS — I10 Essential (primary) hypertension: Secondary | ICD-10-CM | POA: Diagnosis not present

## 2020-02-13 DIAGNOSIS — F419 Anxiety disorder, unspecified: Secondary | ICD-10-CM | POA: Insufficient documentation

## 2020-02-13 DIAGNOSIS — Z86010 Personal history of colonic polyps: Secondary | ICD-10-CM | POA: Insufficient documentation

## 2020-02-13 DIAGNOSIS — R69 Illness, unspecified: Secondary | ICD-10-CM | POA: Diagnosis not present

## 2020-02-13 DIAGNOSIS — D709 Neutropenia, unspecified: Secondary | ICD-10-CM

## 2020-02-13 LAB — CMP (CANCER CENTER ONLY)
ALT: 33 U/L (ref 0–44)
AST: 26 U/L (ref 15–41)
Albumin: 4 g/dL (ref 3.5–5.0)
Alkaline Phosphatase: 57 U/L (ref 38–126)
Anion gap: 5 (ref 5–15)
BUN: 14 mg/dL (ref 8–23)
CO2: 30 mmol/L (ref 22–32)
Calcium: 9.8 mg/dL (ref 8.9–10.3)
Chloride: 104 mmol/L (ref 98–111)
Creatinine: 1.33 mg/dL — ABNORMAL HIGH (ref 0.61–1.24)
GFR, Est AFR Am: >60 mL/min (ref >60)
GFR, Estimated: 54 mL/min — ABNORMAL LOW (ref >60)
Glucose, Bld: 116 mg/dL — ABNORMAL HIGH (ref 70–99)
Potassium: 4.4 mmol/L (ref 3.5–5.1)
Sodium: 139 mmol/L (ref 135–145)
Total Bilirubin: 0.7 mg/dL (ref 0.3–1.2)
Total Protein: 7.2 g/dL (ref 6.5–8.1)

## 2020-02-13 LAB — CBC WITH DIFFERENTIAL (CANCER CENTER ONLY)
Abs Immature Granulocytes: 0.01 10*3/uL (ref 0.00–0.07)
Basophils Absolute: 0 10*3/uL (ref 0.0–0.1)
Basophils Relative: 2 %
Eosinophils Absolute: 0.1 10*3/uL (ref 0.0–0.5)
Eosinophils Relative: 2 %
HCT: 48.2 % (ref 39.0–52.0)
Hemoglobin: 16.3 g/dL (ref 13.0–17.0)
Immature Granulocytes: 0 %
Lymphocytes Relative: 48 %
Lymphs Abs: 1.2 10*3/uL (ref 0.7–4.0)
MCH: 30.9 pg (ref 26.0–34.0)
MCHC: 33.8 g/dL (ref 30.0–36.0)
MCV: 91.5 fL (ref 80.0–100.0)
Monocytes Absolute: 0.3 10*3/uL (ref 0.1–1.0)
Monocytes Relative: 12 %
Neutro Abs: 0.9 10*3/uL — ABNORMAL LOW (ref 1.7–7.7)
Neutrophils Relative %: 36 %
Platelet Count: 155 10*3/uL (ref 150–400)
RBC: 5.27 MIL/uL (ref 4.22–5.81)
RDW: 13.3 % (ref 11.5–15.5)
WBC Count: 2.5 10*3/uL — ABNORMAL LOW (ref 4.0–10.5)
nRBC: 0 % (ref 0.0–0.2)

## 2020-02-13 LAB — LACTATE DEHYDROGENASE: LDH: 157 U/L (ref 98–192)

## 2020-02-13 NOTE — Progress Notes (Signed)
Keith Bauer    HEMATOLOGY/ONCOLOGY CLINIC NOTE  Date of Service: .01/06/2017  Patient Care Team: Hulan Fess, MD as PCP - General (Family Medicine)  CHIEF COMPLAINTS/PURPOSE OF CONSULTATION:  Thrombocytopenia and leukopenia  HISTORY OF PRESENTING ILLNESS:  Keith Bauer is a wonderful 69 y.o. male who has been referred to [place] by Dr Hulan Fess, MD for evaluation and management of thrombocytopenia and leukopenia.  Patient has a history of hypertension, dyslipidemia, impaired glucose tolerance, anxiety, erectile dysfunction, fatty liver noted on CT scan of the abdomen and pelvis in 2013, nephrolithiasis. He was referred for evaluation of his chronic leukopenia and thrombocytopenia. Based on review of his labs sent by his primary care physician his hemoglobin has been normal. His platelet counts have been low at least from February 2015 when they were 138k there were 148,000 on 08/22/2014 and recently were 125k on 09/05/2015.  His WBC counts have been around 2.5k with the highest number since 08/03/2013 of 2.8k on 03/15/2014. Noted to have neutropenia with an [place] fluctuating between [phone]. ANC was 1000 on 09/05/2015 with a total WBC count of 2.5k.  He reports no issues with frequent infections or uncontrolled infection. No issues with bleeding or easy bruisability. He had a hepatitis C test on 08/01/2014 which was negative. HIV testing on 08/01/2014 was nonreactive.  Patient notes no familial history of blood disorders, problems of infection.  He notes that he feels well. No fevers or chills. No unexpected weight loss. Good energy levels. No other acute new focal symptoms. Thus take a multivitamin daily. No other recent new medications..   INTERVAL HISTORY Keith Bauer is here for follow-up of his idiopathic thrombocytopenia and leukopenia. The patient's last visit with [place] was on 08/16/2019. The pt reports that he is doing well overall.  The pt reports that he has felt well and denies any  new infections. Pt is currently following with Dr. Rex Kras, his PCP, annually. Pt has received his COVID19 vaccines and is considering getting the booster.   Lab results today (02/13/20) of CBC w/diff and CMP is as follows: all values are WNL except for WBC at 2.5K, Neutro Abs at 0.9K, Glucose at 116, Creatinine at 1.33. 02/13/2020 LDH at 157 02/13/2020 Folate RBC is in progress  On review of systems, pt denies fevers, chills, night sweats, abdominal pain and any other symptoms.   MEDICAL HISTORY:  Past Medical History:  Diagnosis Date  . Anxiety   . Colon polyp   . Erectile dysfunction   . Fatty liver   . Hyperlipemia   . Hypertension   . Impaired glucose tolerance   . Left leg pain   . Low HDL (under 40)   . Nephrolithiasis    left kidney    SURGICAL HISTORY: Past Surgical History:  Procedure Laterality Date  . COLONOSCOPY  04, 08, 13    SOCIAL HISTORY: Social History   Socioeconomic History  . Marital status: Single    Spouse name: Not on file  . Number of children: 3  . Years of education: some college  . Highest education level: Not on file  Occupational History  . 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 Smoking status: Never Smoker  . Smokeless tobacco: Never Used  Vaping Use  . Vaping Use: Never used  Substance and Sexual Activity  . Alcohol use: Yes    Alcohol/week: 0.0 standard drinks    Comment: 3-5 drinks per week  . Drug  use: No  . Sexual activity: Not on file  Other Topics Concern  . Not on file  Social History Narrative   Lives at home with his family.   Right-handed.   8-12 ounces caffeine per day.   Social Determinants of Health   Financial Resource Strain:   . Difficulty of Paying Living Expenses: Not on file  Food Insecurity:   . Worried About Charity fundraiser in the Last Year: Not on file  . Ran Out of Food in the Last Year: Not on file  Transportation Needs:   . Lack of  Transportation (Medical): Not on file  . Lack of Transportation (Non-Medical): Not on file  Physical Activity:   . Days of Exercise per Week: Not on file  . Minutes of Exercise per Session: Not on file  Stress:   . Feeling of Stress : Not on file  Social Connections:   . Frequency of Communication with Friends and Family: Not on file  . Frequency of Social Gatherings with Friends and Family: Not on file  . Attends Religious Services: Not on file  . Active Member of Clubs or Organizations: Not on file  . Attends Archivist Meetings: Not on file  . Marital Status: Not on file  Intimate Partner Violence:   . Fear of Current or Ex-Partner: Not on file  . Emotionally Abused: Not on file  . Physically Abused: Not on file  . Sexually Abused: Not on file    FAMILY HISTORY: Family History  Problem Relation Age of Onset  . Cancer Mother        colon, rectal, ovarian  . Liver cancer Father   . Heart disease Brother   . Liver disease Sister     ALLERGIES:  has No Known Allergies.  MEDICATIONS:  Current Outpatient Medications  Medication Sig Dispense Refill  . ALPRAZolam (XANAX) 0.5 MG tablet Take 0.25-0.5 mg by mouth daily as needed for anxiety.    Keith Bauer atorvastatin (LIPITOR) 10 MG tablet Take 10 mg by mouth daily.      Keith Bauer losartan (COZAAR) 50 MG tablet Take 50 mg by mouth daily.    . Multiple Vitamins-Minerals (MULTIVITAMINS THER. W/MINERALS) TABS Take 1 tablet by mouth daily.      Keith Bauer PARoxetine (PAXIL) 20 MG tablet TK 1 T PO QD IN THE MORNING  3  . sildenafil (VIAGRA) 100 MG tablet Take 100 mg by mouth daily as needed for erectile dysfunction.     No current facility-administered medications for this visit.    REVIEW OF SYSTEMS:   A 10+ POINT REVIEW OF SYSTEMS WAS OBTAINED including neurology, dermatology, psychiatry, cardiac, respiratory, lymph, extremities, GI, GU, Musculoskeletal, constitutional, breasts, reproductive, HEENT.  All pertinent positives are noted in the HPI.   All others are negative.   PHYSICAL EXAMINATION: ECOG PERFORMANCE STATUS: 0 - Asymptomatic  . Vitals:   02/13/20 1202  BP: 132/77  Pulse: 63  Resp: 18  Temp: (!) 96.7 F (35.9 C)  SpO2: 100%   Filed Weights   02/13/20 1202  Weight: 195 lb 4.8 oz (88.6 kg)   .Body mass index is 28.84 kg/m.   GENERAL:alert, in no acute distress and comfortable SKIN: no acute rashes, no significant lesions EYES: conjunctiva are pink and non-injected, sclera anicteric OROPHARYNX: MMM, no exudates, no oropharyngeal erythema or ulceration NECK: supple, no JVD LYMPH:  no palpable lymphadenopathy in the cervical, axillary or inguinal regions LUNGS: clear to auscultation b/l with normal respiratory effort HEART: regular  rate & rhythm ABDOMEN:  normoactive bowel sounds , non tender, not distended. No palpable hepatosplenomegaly.  Extremity: no pedal edema PSYCH: alert & oriented x 3 with fluent speech NEURO: no focal motor/sensory deficits  LABORATORY DATA:  I have reviewed the data as listed  . CBC Latest Ref Rng & Units 02/13/2020 08/16/2019 08/16/2019  WBC 4.0 - 10.5 K/uL 2.5(L) 2.3(L) -  Hemoglobin 13.0 - 17.0 g/dL 16.3 16.8 -  Hematocrit 39 - 52 % 48.2 49.4 49.6  Platelets 150 - 400 K/uL 155 158 -   ANC 1000---> 1300-->1600--->1100 . CMP Latest Ref Rng & Units 02/13/2020 08/16/2019 06/09/2017  Glucose 70 - 99 mg/dL 116(H) 128(H) 141(H)  BUN 8 - 23 mg/dL 14 12 15.8  Creatinine 0.61 - 1.24 mg/dL 1.33(H) 1.27(H) 1.3  Sodium 135 - 145 mmol/L 139 142 138  Potassium 3.5 - 5.1 mmol/L 4.4 4.4 4.1  Chloride 98 - 111 mmol/L 104 105 -  CO2 22 - 32 mmol/L 30 29 28   Calcium 8.9 - 10.3 mg/dL 9.8 9.6 9.4  Total Protein 6.5 - 8.1 g/dL 7.2 7.5 7.1  Total Bilirubin 0.3 - 1.2 mg/dL 0.7 0.9 0.82  Alkaline Phos 38 - 126 U/L 57 61 56  AST 15 - 41 U/L 26 26 28   ALT 0 - 44 U/L 33 36 34     RADIOGRAPHIC STUDIES: I have personally reviewed the radiological images as listed and agreed with the findings  in the report. No results found.  ASSESSMENT & PLAN:   69 year old African-American male with  #1 Chronic thrombocytopenia at least from June 2014. Without significant worsening. No issues with bleeding despite being on a full dose aspirin daily.  Denies drug abuse/alcohol abuse. No fevers/chills/night sweats/unexpected weight loss/lymphadenopathy/hepatosplenomegaly to suggest a lymphoproliferative disorder. No clinical stigmata of chronic autoimmune disease. Platelet counts have now normalized at 148k  #2 Mild leukopenia with neutropenia. No issues with frequent infections. This is also been at least since June 2014. 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 No evidence of overt autoimmune condition or specific medication exposures. Denies alcohol abuse or drug use. WBC have remained stable in the 2.5k-3.5k range with stable ANC in in 1000-1300 range (1100 today) Flow Pathology Report ([id number]): - No monoclonal B-cell population identified. - Predominance of T-cells with non-specific changes.  Plan: -Discussed pt labwork today, 02/13/20; thrombocytopenia has resolved, chronic neutropenia, LDH is WNL, Folate RBC is in progress -His clinical status is unchanged with no constitutional symptoms, bone pains. No issues with bleeding or infections. 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  -Will allow pt f/u with PCP for continued lab monitoring. Advised pt to contact if he begins to experience frequent or abnormal infections.  -Discussed CDC guidelines regarding COVID19 vaccine booster. Recommend pt receive it when possible.  -Recommend pt f/u with Dr. Rex Kras  -Will see back as needed    FOLLOW UP: RTC with PCP, F/u with Dr Irene Limbo as needed   The total time spent in the appt was 20 minutes and more than 50% was on counseling and direct patient cares.  All of the patient's questions were answered with apparent satisfaction. The  patient knows to call the clinic with any problems, questions or concerns.   Sullivan Lone MD [place] AAHIVMS [facility] Hematology/Oncology Physician [facility]  (Office):       [phone] (Work cell):  [phone] (Fax):           [phone]  I, Yevette Edwards, am acting as a Education administrator for Dr. Sullivan Lone.   .I have reviewed the above documentation for accuracy and completeness, and I agree with the above. Brunetta Genera MD

## 2020-02-14 LAB — FOLATE RBC
Folate, Hemolysate: 437 ng/mL
Folate, RBC: 881 ng/mL
Hematocrit: 49.6 % (ref 37.5–51.0)

## 2020-02-27 DIAGNOSIS — Z20828 Contact with and (suspected) exposure to other viral communicable diseases: Secondary | ICD-10-CM | POA: Diagnosis not present

## 2020-03-07 DIAGNOSIS — Z20828 Contact with and (suspected) exposure to other viral communicable diseases: Secondary | ICD-10-CM | POA: Diagnosis not present

## 2020-03-11 DIAGNOSIS — R69 Illness, unspecified: Secondary | ICD-10-CM | POA: Diagnosis not present

## 2020-03-19 DIAGNOSIS — E1169 Type 2 diabetes mellitus with other specified complication: Secondary | ICD-10-CM | POA: Diagnosis not present

## 2020-03-19 DIAGNOSIS — I10 Essential (primary) hypertension: Secondary | ICD-10-CM | POA: Diagnosis not present

## 2020-03-19 DIAGNOSIS — E785 Hyperlipidemia, unspecified: Secondary | ICD-10-CM | POA: Diagnosis not present

## 2020-03-20 DIAGNOSIS — R69 Illness, unspecified: Secondary | ICD-10-CM | POA: Diagnosis not present

## 2020-03-26 DIAGNOSIS — Z20828 Contact with and (suspected) exposure to other viral communicable diseases: Secondary | ICD-10-CM | POA: Diagnosis not present

## 2020-04-08 DIAGNOSIS — I10 Essential (primary) hypertension: Secondary | ICD-10-CM | POA: Diagnosis not present

## 2020-04-08 DIAGNOSIS — E785 Hyperlipidemia, unspecified: Secondary | ICD-10-CM | POA: Diagnosis not present

## 2020-04-08 DIAGNOSIS — E1169 Type 2 diabetes mellitus with other specified complication: Secondary | ICD-10-CM | POA: Diagnosis not present

## 2020-04-29 DIAGNOSIS — E785 Hyperlipidemia, unspecified: Secondary | ICD-10-CM | POA: Diagnosis not present

## 2020-04-29 DIAGNOSIS — I10 Essential (primary) hypertension: Secondary | ICD-10-CM | POA: Diagnosis not present

## 2020-04-29 DIAGNOSIS — E1169 Type 2 diabetes mellitus with other specified complication: Secondary | ICD-10-CM | POA: Diagnosis not present

## 2020-05-05 DIAGNOSIS — J392 Other diseases of pharynx: Secondary | ICD-10-CM | POA: Diagnosis not present

## 2020-05-05 DIAGNOSIS — R059 Cough, unspecified: Secondary | ICD-10-CM | POA: Diagnosis not present

## 2020-05-05 DIAGNOSIS — R519 Headache, unspecified: Secondary | ICD-10-CM | POA: Diagnosis not present

## 2020-05-05 DIAGNOSIS — U071 COVID-19: Secondary | ICD-10-CM | POA: Diagnosis not present

## 2020-05-06 ENCOUNTER — Telehealth (HOSPITAL_COMMUNITY): Payer: Self-pay

## 2020-05-06 ENCOUNTER — Other Ambulatory Visit (HOSPITAL_COMMUNITY): Payer: Self-pay | Admitting: Physician Assistant

## 2020-05-06 NOTE — Telephone Encounter (Signed)
Called to Discuss with patient about Covid symptoms and the use of the monoclonal antibody infusion for those with mild to moderate Covid symptoms and at a high risk of hospitalization.     Pt appears to qualify for this infusion due to co-morbid conditions and/or a member of an at-risk group in accordance with the FDA Emergency Use Authorization.    Risk factors include age and race. Sx onset 05/03/20. Positive COVID test 05/05/20.  Pre-screened by RN and ready for APP to call and answer further questions/set up appt time.  Pt advised about cost of treatment and encouraged to call insurance company prior to coming in for treatment.

## 2020-05-06 NOTE — Progress Notes (Signed)
I connected by phone with Edgardo Roys on 05/06/2020 at 5:45 PM to discuss the potential use of a new treatment for mild to moderate COVID-19 viral infection in non-hospitalized patients.  This patient is a 69 y.o. male that meets the FDA criteria for Emergency Use Authorization of COVID monoclonal antibody casirivimab/imdevimab, bamlanivimab/eteseviamb, or sotrovimab.  Has a (+) direct SARS-CoV-2 viral test result  Has mild or moderate COVID-19   Is NOT hospitalized due to COVID-19  Is within 10 days of symptom onset  Has at least one of the high risk factor(s) for progression to severe COVID-19 and/or hospitalization as defined in EUA.  Specific high risk criteria : Older age (>/= 69 yo) and Cardiovascular disease or hypertension   I have spoken and communicated the following to the patient or parent/caregiver regarding COVID monoclonal antibody treatment:  1. FDA has authorized the emergency use for the treatment of mild to moderate COVID-19 in adults and pediatric patients with positive results of direct SARS-CoV-2 viral testing who are 70 years of age and older weighing at least 40 kg, and who are at high risk for progressing to severe COVID-19 and/or hospitalization.  2. The significant known and potential risks and benefits of COVID monoclonal antibody, and the extent to which such potential risks and benefits are unknown.  3. Information on available alternative treatments and the risks and benefits of those alternatives, including clinical trials.  4. Patients treated with COVID monoclonal antibody should continue to self-isolate and use infection control measures (e.g., wear mask, isolate, social distance, avoid sharing personal items, clean and disinfect "high touch" surfaces, and frequent handwashing) according to CDC guidelines.   5. The patient or parent/caregiver has the option to accept or refuse COVID monoclonal antibody treatment.  After reviewing this information  with the patient, the patient has agreed to receive one of the available covid 19 monoclonal antibodies and will be provided an appropriate fact sheet prior to infusion. Konrad Felix, PA-C 05/06/2020 5:45 PM

## 2020-05-07 ENCOUNTER — Ambulatory Visit (HOSPITAL_COMMUNITY)
Admission: RE | Admit: 2020-05-07 | Discharge: 2020-05-07 | Disposition: A | Discharge status: Home / Self Care | Payer: Medicare HMO | Source: Ambulatory Visit | Attending: Pulmonary Disease | Admitting: Pulmonary Disease

## 2020-05-07 ENCOUNTER — Other Ambulatory Visit (HOSPITAL_COMMUNITY): Payer: Self-pay

## 2020-05-07 DIAGNOSIS — Z23 Encounter for immunization: Secondary | ICD-10-CM | POA: Diagnosis not present

## 2020-05-07 DIAGNOSIS — R54 Age-related physical debility: Secondary | ICD-10-CM | POA: Insufficient documentation

## 2020-05-07 DIAGNOSIS — U071 COVID-19: Secondary | ICD-10-CM | POA: Insufficient documentation

## 2020-05-07 DIAGNOSIS — I10 Essential (primary) hypertension: Secondary | ICD-10-CM | POA: Insufficient documentation

## 2020-05-07 MED ORDER — DIPHENHYDRAMINE HCL 50 MG/ML IJ SOLN: 50.0000 mg | Freq: Once | INTRAMUSCULAR | Status: DC | PRN

## 2020-05-07 MED ORDER — ALBUTEROL SULFATE HFA 108 (90 BASE) MCG/ACT IN AERS: 2.0000 | INHALATION_SPRAY | Freq: Once | RESPIRATORY_TRACT | Status: DC | PRN

## 2020-05-07 MED ORDER — FAMOTIDINE IN NACL 20-0.9 MG/50ML-% IV SOLN: 20.0000 mg | Freq: Once | INTRAVENOUS | Status: DC | PRN

## 2020-05-07 MED ORDER — METHYLPREDNISOLONE SODIUM SUCC 125 MG IJ SOLR: 125.0000 mg | Freq: Once | INTRAMUSCULAR | Status: DC | PRN

## 2020-05-07 MED ORDER — EPINEPHRINE 0.3 MG/0.3ML IJ SOAJ: 0.3000 mg | Freq: Once | INTRAMUSCULAR | Status: DC | PRN

## 2020-05-07 MED ORDER — SODIUM CHLORIDE 0.9 % IV SOLN: INTRAVENOUS | Status: DC | PRN

## 2020-05-07 MED ORDER — SOTROVIMAB 500 MG/8ML IV SOLN
500.0000 mg | Freq: Once | INTRAVENOUS | Status: AC
  Administered 2020-05-07: 500 mg via INTRAVENOUS

## 2020-05-07 NOTE — Progress Notes (Signed)
Diagnosis: COVID-19  Physician: Dr. Patrick Wright  Procedure: [facility] Med: Sotrovimab infusion - Provided patient with sotrovimab fact sheet for patients, parents, and caregivers prior to infusion.   Complications: No immediate complications noted  Discharge: Discharged home  If after the infusion you have any questions or concerns please call the Advanced Practice Provider at [phone] 

## 2020-05-07 NOTE — Discharge Instructions (Signed)
10 Things You Can Do to Manage Your COVID-19 Symptoms at Home If you have possible or confirmed COVID-19: 1. Stay home from work and school. And stay away from other public places. If you must go out, avoid using any kind of public transportation, ridesharing, or taxis. 2. Monitor your symptoms carefully. If your symptoms get worse, call your healthcare provider immediately. 3. Get rest and stay hydrated. 4. If you have a medical appointment, call the healthcare provider ahead of time and tell them that you have or may have COVID-19. 5. For medical emergencies, call 911 and notify the dispatch personnel that you have or may have COVID-19. 6. Cover your cough and sneezes with a tissue or use the inside of your elbow. 7. Wash your hands often with soap and water for at least 20 seconds or clean your hands with an alcohol-based hand sanitizer that contains at least 60% alcohol. 8. As much as possible, stay in a specific room and away from other people in your home. Also, you should use a separate bathroom, if available. If you need to be around other people in or outside of the home, wear a mask. 9. Avoid sharing personal items with other people in your household, like dishes, towels, and bedding. 10. Clean all surfaces that are touched often, like counters, tabletops, and doorknobs. Use household cleaning sprays or wipes according to the label instructions. [email or web address] 12/20/2018 This information is not intended to replace advice given to you by your health care provider. Make sure you discuss any questions you have with your health care provider. Document Revised: 05/24/2019 Document Reviewed: 05/24/2019 Elsevier Patient Education  [place].  What types of side effects do monoclonal antibody drugs cause?  Common side effects  In general, the more common side effects caused by monoclonal antibody drugs include: . Allergic reactions, such as hives or itching . Flu-like signs and  symptoms, including chills, fatigue, fever, and muscle aches and pains . Nausea, vomiting . Diarrhea . Skin rashes . Low blood pressure   The CDC is recommending patients who receive monoclonal antibody treatments wait at least 90 days before being vaccinated.  Currently, there are no data on the safety and efficacy of mRNA COVID-19 vaccines in persons who received monoclonal antibodies or convalescent plasma as part of COVID-19 treatment. Based on the estimated half-life of such therapies as well as evidence suggesting that reinfection is uncommon in the 90 days after initial infection, vaccination should be deferred for at least 90 days, as a precautionary measure until additional information becomes available, to avoid interference of the antibody treatment with vaccine-induced immune responses.  Discharged home with D/C papers which includes the Advanced Practice Provider at [phone] if patient were to have any questions or concerns.

## 2020-05-07 NOTE — Progress Notes (Signed)
  Diagnosis: COVID-19  Physician: Dr. Asencion Noble   Procedure: Allergies reviewed.  Sotrovimab administered via IV infusion after med fact sheet provided to patient and all questions answered. Discharge instructions provided to patient; all questions answered.   Complications: No immediate complications noted.  Discharge: Discharged home with D/C papers which includes the Advanced Practice Provider at [phone] if patient were to have any questions or concerns.    Monna Fam 05/07/2020

## 2020-05-30 DIAGNOSIS — E785 Hyperlipidemia, unspecified: Secondary | ICD-10-CM | POA: Diagnosis not present

## 2020-05-30 DIAGNOSIS — I10 Essential (primary) hypertension: Secondary | ICD-10-CM | POA: Diagnosis not present

## 2020-05-30 DIAGNOSIS — E1169 Type 2 diabetes mellitus with other specified complication: Secondary | ICD-10-CM | POA: Diagnosis not present

## 2020-06-10 DIAGNOSIS — M79672 Pain in left foot: Secondary | ICD-10-CM | POA: Diagnosis not present

## 2020-06-23 DIAGNOSIS — H40013 Open angle with borderline findings, low risk, bilateral: Secondary | ICD-10-CM | POA: Diagnosis not present

## 2020-06-27 DIAGNOSIS — E785 Hyperlipidemia, unspecified: Secondary | ICD-10-CM | POA: Diagnosis not present

## 2020-06-27 DIAGNOSIS — E1169 Type 2 diabetes mellitus with other specified complication: Secondary | ICD-10-CM | POA: Diagnosis not present

## 2020-06-27 DIAGNOSIS — I10 Essential (primary) hypertension: Secondary | ICD-10-CM | POA: Diagnosis not present

## 2020-07-23 DIAGNOSIS — E1169 Type 2 diabetes mellitus with other specified complication: Secondary | ICD-10-CM | POA: Diagnosis not present

## 2020-07-23 DIAGNOSIS — I10 Essential (primary) hypertension: Secondary | ICD-10-CM | POA: Diagnosis not present

## 2020-07-23 DIAGNOSIS — E785 Hyperlipidemia, unspecified: Secondary | ICD-10-CM | POA: Diagnosis not present

## 2020-09-04 DIAGNOSIS — R519 Headache, unspecified: Secondary | ICD-10-CM | POA: Diagnosis not present

## 2020-09-04 DIAGNOSIS — Z0000 Encounter for general adult medical examination without abnormal findings: Secondary | ICD-10-CM | POA: Diagnosis not present

## 2020-09-04 DIAGNOSIS — R69 Illness, unspecified: Secondary | ICD-10-CM | POA: Diagnosis not present

## 2020-09-04 DIAGNOSIS — R059 Cough, unspecified: Secondary | ICD-10-CM | POA: Diagnosis not present

## 2020-09-04 DIAGNOSIS — E1169 Type 2 diabetes mellitus with other specified complication: Secondary | ICD-10-CM | POA: Diagnosis not present

## 2020-09-04 DIAGNOSIS — Z86010 Personal history of colonic polyps: Secondary | ICD-10-CM | POA: Diagnosis not present

## 2020-09-04 DIAGNOSIS — I10 Essential (primary) hypertension: Secondary | ICD-10-CM | POA: Diagnosis not present

## 2020-09-04 DIAGNOSIS — J392 Other diseases of pharynx: Secondary | ICD-10-CM | POA: Diagnosis not present

## 2020-09-04 DIAGNOSIS — E785 Hyperlipidemia, unspecified: Secondary | ICD-10-CM | POA: Diagnosis not present

## 2020-09-17 DIAGNOSIS — I10 Essential (primary) hypertension: Secondary | ICD-10-CM | POA: Diagnosis not present

## 2020-09-17 DIAGNOSIS — E1169 Type 2 diabetes mellitus with other specified complication: Secondary | ICD-10-CM | POA: Diagnosis not present

## 2020-09-17 DIAGNOSIS — E785 Hyperlipidemia, unspecified: Secondary | ICD-10-CM | POA: Diagnosis not present

## 2020-10-18 IMAGING — US ULTRASOUND ABDOMEN COMPLETE
1 series · 14 of 25 positions shown · non-contrast
Comparison: CT abdomen and pelvis 04/28/2012

CLINICAL DATA: Leukopenia, thrombocytopenia

EXAM:
ABDOMEN ULTRASOUND COMPLETE

[Series 1: ultrasound abdomen complete · 0.19mm/px · 14 of 96 slices shown]
[im 1/96]
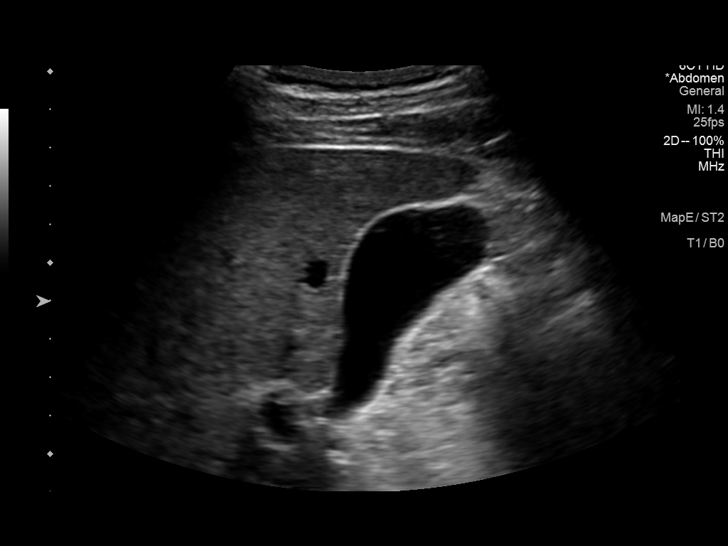
[im 8/96]
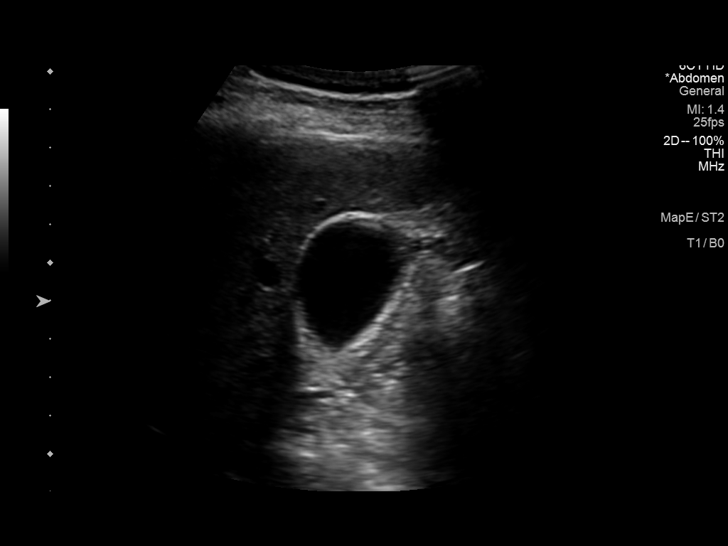
[im 16/96]
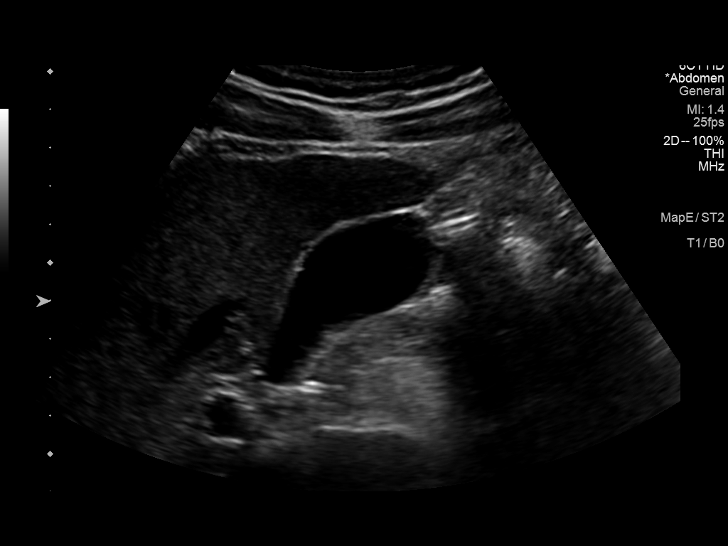
[im 24/96]
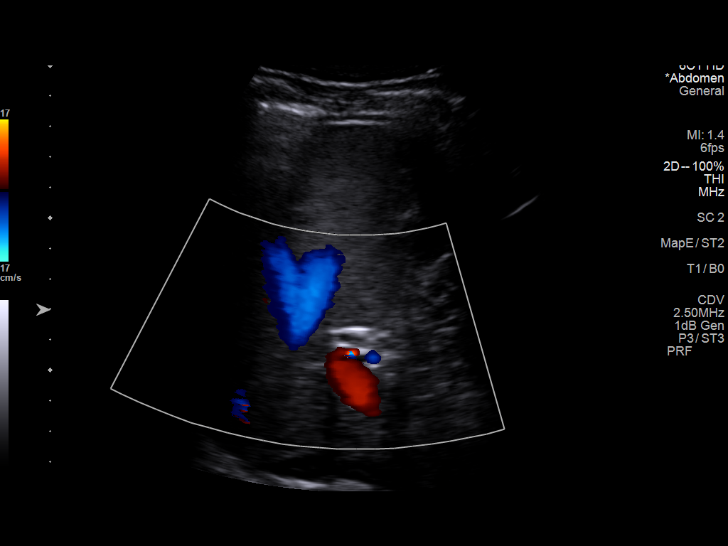
[im 32/96]
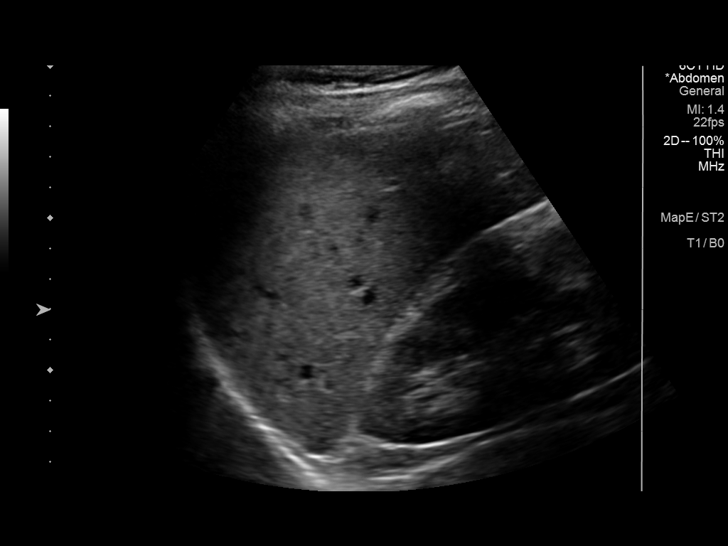
[im 36/96]
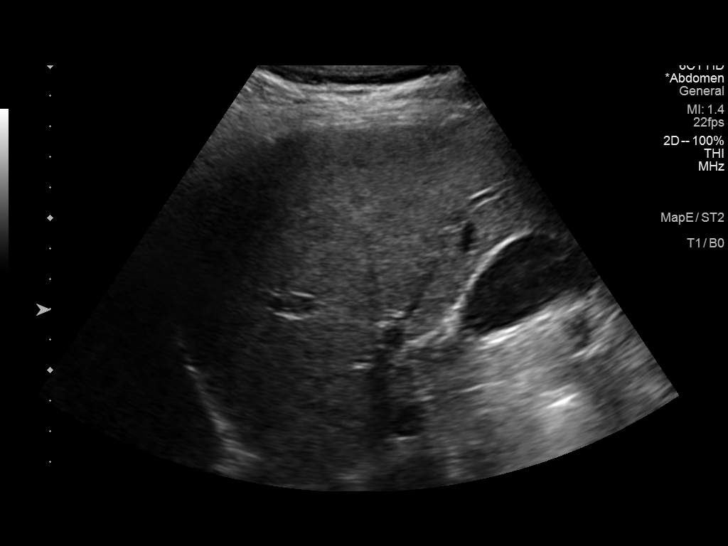
[im 44/96]
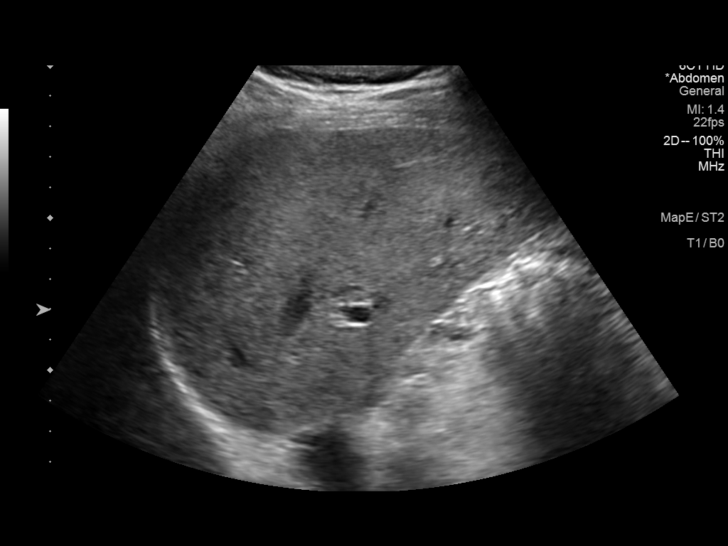
[im 52/96]
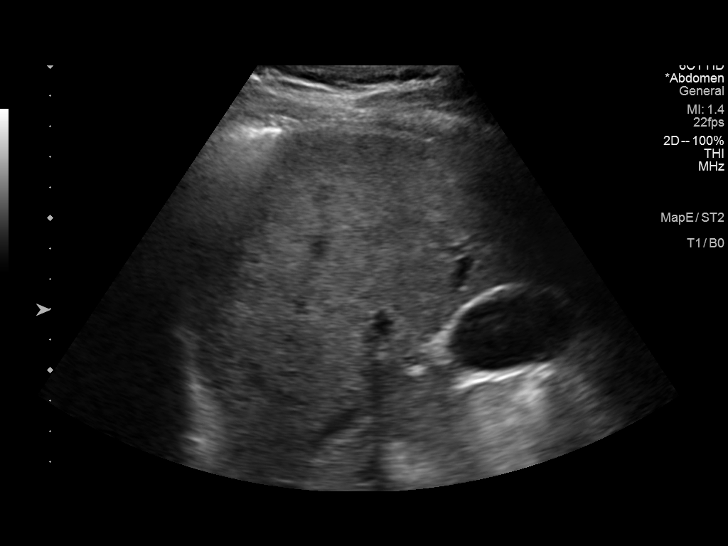
[im 60/96]
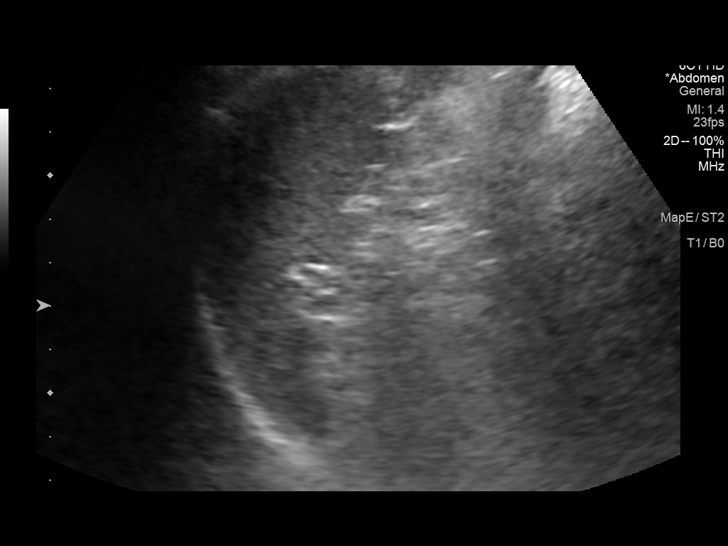
[im 64/96]
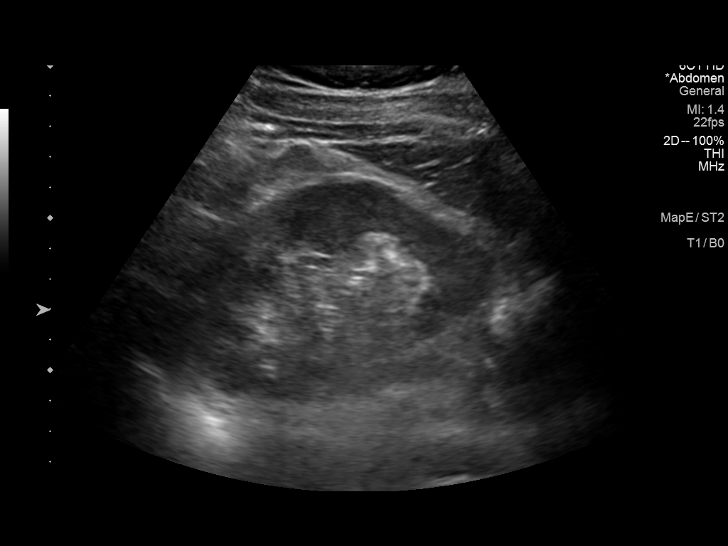
[im 72/96]
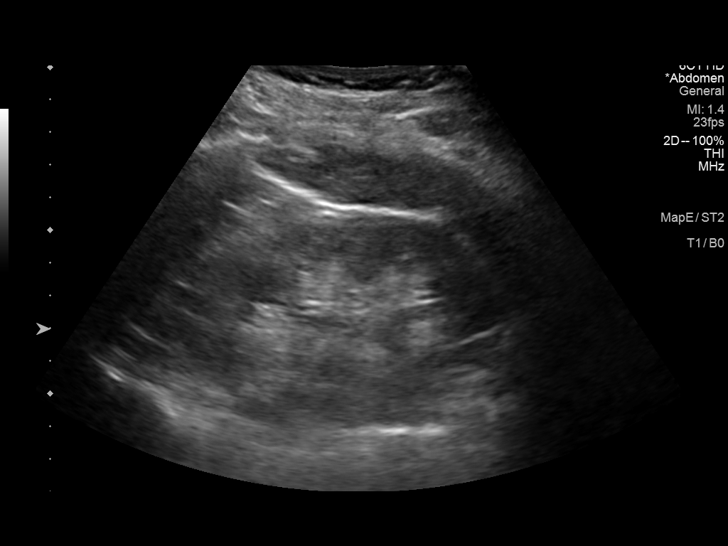
[im 80/96]
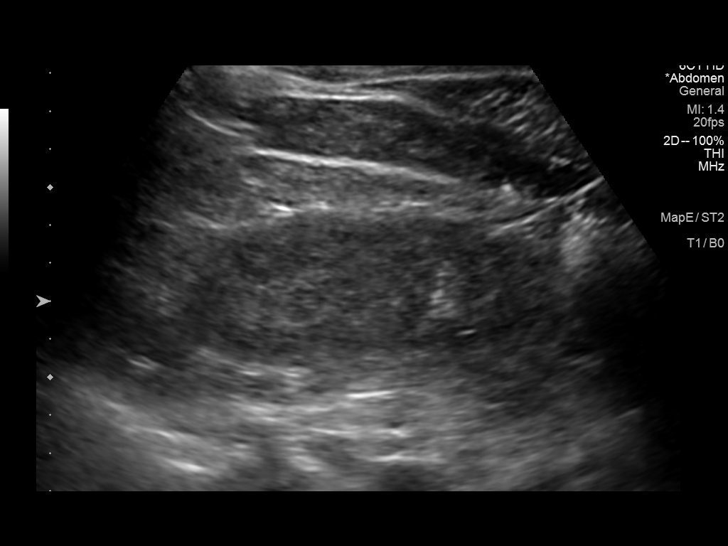
[im 88/96]
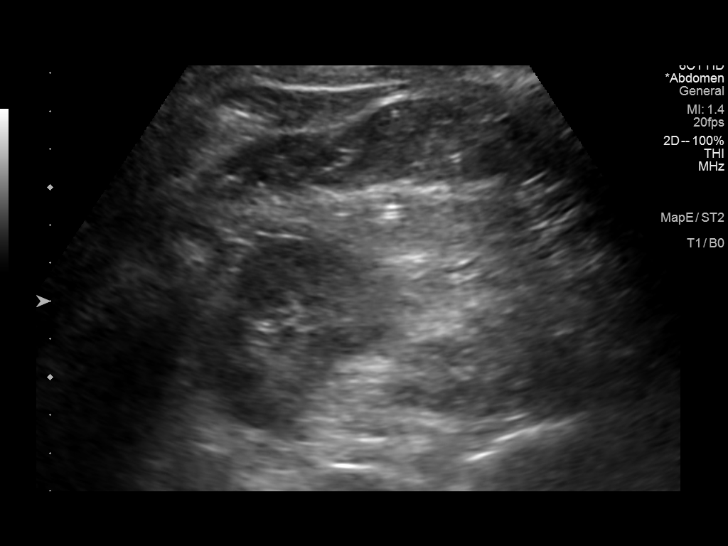
[im 96/96]
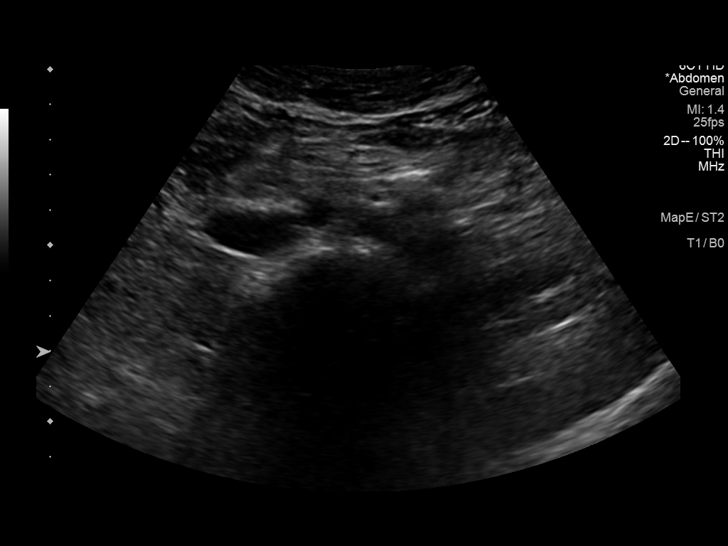

[14 of 25 positions shown; findings below may reference images not displayed]

FINDINGS: Gallbladder: Normally distended without stones or wall thickening.
No pericholecystic fluid or sonographic Murphy sign.

Common bile duct: Diameter: 4 mm diameter, normal

Liver: Normal echogenicity. No mass or nodularity. Portal vein
patent with normal direction of blood flow towards liver

IVC: Normal appearance

Pancreas: Normal appearance

Spleen: Normal appearance, 5.5 cm length

Right Kidney: Length: 9.9 cm. Normal cortical thickness. Minimally
increased cortical echogenicity. No mass or hydronephrosis.

Left Kidney: Length: 10.1 cm. Normal cortical thickness. Minimally
increased cortical echogenicity.

Abdominal aorta: Normal caliber

Other findings: No free fluid
IMPRESSION: Probable mild medical renal disease changes of both kidneys.

Otherwise negative exam.

## 2020-10-20 DIAGNOSIS — E785 Hyperlipidemia, unspecified: Secondary | ICD-10-CM | POA: Diagnosis not present

## 2020-10-20 DIAGNOSIS — E1169 Type 2 diabetes mellitus with other specified complication: Secondary | ICD-10-CM | POA: Diagnosis not present

## 2020-10-20 DIAGNOSIS — I10 Essential (primary) hypertension: Secondary | ICD-10-CM | POA: Diagnosis not present

## 2020-11-07 DIAGNOSIS — Z0100 Encounter for examination of eyes and vision without abnormal findings: Secondary | ICD-10-CM | POA: Diagnosis not present

## 2020-11-18 DIAGNOSIS — I10 Essential (primary) hypertension: Secondary | ICD-10-CM | POA: Diagnosis not present

## 2020-11-24 DIAGNOSIS — Z0000 Encounter for general adult medical examination without abnormal findings: Secondary | ICD-10-CM | POA: Diagnosis not present

## 2020-11-24 DIAGNOSIS — Z1389 Encounter for screening for other disorder: Secondary | ICD-10-CM | POA: Diagnosis not present

## 2020-11-25 DIAGNOSIS — E785 Hyperlipidemia, unspecified: Secondary | ICD-10-CM | POA: Diagnosis not present

## 2020-11-25 DIAGNOSIS — R69 Illness, unspecified: Secondary | ICD-10-CM | POA: Diagnosis not present

## 2020-11-25 DIAGNOSIS — K760 Fatty (change of) liver, not elsewhere classified: Secondary | ICD-10-CM | POA: Diagnosis not present

## 2020-11-25 DIAGNOSIS — Z125 Encounter for screening for malignant neoplasm of prostate: Secondary | ICD-10-CM | POA: Diagnosis not present

## 2020-11-25 DIAGNOSIS — E1169 Type 2 diabetes mellitus with other specified complication: Secondary | ICD-10-CM | POA: Diagnosis not present

## 2020-11-25 DIAGNOSIS — I10 Essential (primary) hypertension: Secondary | ICD-10-CM | POA: Diagnosis not present

## 2020-11-25 DIAGNOSIS — Z7984 Long term (current) use of oral hypoglycemic drugs: Secondary | ICD-10-CM | POA: Diagnosis not present

## 2020-12-08 DIAGNOSIS — R69 Illness, unspecified: Secondary | ICD-10-CM | POA: Diagnosis not present

## 2020-12-08 DIAGNOSIS — I10 Essential (primary) hypertension: Secondary | ICD-10-CM | POA: Diagnosis not present

## 2020-12-08 DIAGNOSIS — E1169 Type 2 diabetes mellitus with other specified complication: Secondary | ICD-10-CM | POA: Diagnosis not present

## 2020-12-08 DIAGNOSIS — E785 Hyperlipidemia, unspecified: Secondary | ICD-10-CM | POA: Diagnosis not present

## 2020-12-08 DIAGNOSIS — Z125 Encounter for screening for malignant neoplasm of prostate: Secondary | ICD-10-CM | POA: Diagnosis not present

## 2020-12-08 DIAGNOSIS — K760 Fatty (change of) liver, not elsewhere classified: Secondary | ICD-10-CM | POA: Diagnosis not present

## 2020-12-18 DIAGNOSIS — I10 Essential (primary) hypertension: Secondary | ICD-10-CM | POA: Diagnosis not present

## 2020-12-19 DIAGNOSIS — E119 Type 2 diabetes mellitus without complications: Secondary | ICD-10-CM | POA: Diagnosis not present

## 2021-01-16 DIAGNOSIS — I10 Essential (primary) hypertension: Secondary | ICD-10-CM | POA: Diagnosis not present

## 2021-01-17 DIAGNOSIS — I10 Essential (primary) hypertension: Secondary | ICD-10-CM | POA: Diagnosis not present

## 2021-01-19 DIAGNOSIS — D124 Benign neoplasm of descending colon: Secondary | ICD-10-CM | POA: Diagnosis not present

## 2021-01-19 DIAGNOSIS — D123 Benign neoplasm of transverse colon: Secondary | ICD-10-CM | POA: Diagnosis not present

## 2021-01-19 DIAGNOSIS — K5730 Diverticulosis of large intestine without perforation or abscess without bleeding: Secondary | ICD-10-CM | POA: Diagnosis not present

## 2021-01-19 DIAGNOSIS — Z86010 Personal history of colonic polyps: Secondary | ICD-10-CM | POA: Diagnosis not present

## 2021-01-19 DIAGNOSIS — K635 Polyp of colon: Secondary | ICD-10-CM | POA: Diagnosis not present

## 2021-01-22 DIAGNOSIS — D123 Benign neoplasm of transverse colon: Secondary | ICD-10-CM | POA: Diagnosis not present

## 2021-01-22 DIAGNOSIS — D124 Benign neoplasm of descending colon: Secondary | ICD-10-CM | POA: Diagnosis not present

## 2021-01-22 DIAGNOSIS — K635 Polyp of colon: Secondary | ICD-10-CM | POA: Diagnosis not present

## 2021-02-07 DIAGNOSIS — R3911 Hesitancy of micturition: Secondary | ICD-10-CM | POA: Diagnosis not present

## 2021-02-07 DIAGNOSIS — R350 Frequency of micturition: Secondary | ICD-10-CM | POA: Diagnosis not present

## 2021-02-07 DIAGNOSIS — R39198 Other difficulties with micturition: Secondary | ICD-10-CM | POA: Diagnosis not present

## 2021-02-07 DIAGNOSIS — R3989 Other symptoms and signs involving the genitourinary system: Secondary | ICD-10-CM | POA: Diagnosis not present

## 2021-02-10 DIAGNOSIS — I10 Essential (primary) hypertension: Secondary | ICD-10-CM | POA: Diagnosis not present

## 2021-02-10 DIAGNOSIS — E1169 Type 2 diabetes mellitus with other specified complication: Secondary | ICD-10-CM | POA: Diagnosis not present

## 2021-02-10 DIAGNOSIS — E785 Hyperlipidemia, unspecified: Secondary | ICD-10-CM | POA: Diagnosis not present

## 2021-02-18 DIAGNOSIS — I10 Essential (primary) hypertension: Secondary | ICD-10-CM | POA: Diagnosis not present

## 2021-03-09 DIAGNOSIS — E785 Hyperlipidemia, unspecified: Secondary | ICD-10-CM | POA: Diagnosis not present

## 2021-03-09 DIAGNOSIS — I10 Essential (primary) hypertension: Secondary | ICD-10-CM | POA: Diagnosis not present

## 2021-03-09 DIAGNOSIS — E1169 Type 2 diabetes mellitus with other specified complication: Secondary | ICD-10-CM | POA: Diagnosis not present

## 2021-03-20 DIAGNOSIS — I10 Essential (primary) hypertension: Secondary | ICD-10-CM | POA: Diagnosis not present

## 2021-04-15 DIAGNOSIS — I10 Essential (primary) hypertension: Secondary | ICD-10-CM | POA: Diagnosis not present

## 2021-04-15 DIAGNOSIS — E1169 Type 2 diabetes mellitus with other specified complication: Secondary | ICD-10-CM | POA: Diagnosis not present

## 2021-04-15 DIAGNOSIS — E785 Hyperlipidemia, unspecified: Secondary | ICD-10-CM | POA: Diagnosis not present

## 2021-04-20 DIAGNOSIS — I10 Essential (primary) hypertension: Secondary | ICD-10-CM | POA: Diagnosis not present

## 2021-05-20 DIAGNOSIS — I10 Essential (primary) hypertension: Secondary | ICD-10-CM | POA: Diagnosis not present

## 2021-06-03 DIAGNOSIS — R3911 Hesitancy of micturition: Secondary | ICD-10-CM | POA: Diagnosis not present

## 2021-06-03 DIAGNOSIS — E1169 Type 2 diabetes mellitus with other specified complication: Secondary | ICD-10-CM | POA: Diagnosis not present

## 2021-06-03 DIAGNOSIS — I10 Essential (primary) hypertension: Secondary | ICD-10-CM | POA: Diagnosis not present

## 2021-06-03 DIAGNOSIS — R69 Illness, unspecified: Secondary | ICD-10-CM | POA: Diagnosis not present

## 2021-06-03 DIAGNOSIS — Z7984 Long term (current) use of oral hypoglycemic drugs: Secondary | ICD-10-CM | POA: Diagnosis not present

## 2021-06-11 DIAGNOSIS — L918 Other hypertrophic disorders of the skin: Secondary | ICD-10-CM | POA: Diagnosis not present

## 2021-06-17 DIAGNOSIS — L989 Disorder of the skin and subcutaneous tissue, unspecified: Secondary | ICD-10-CM | POA: Diagnosis not present

## 2021-06-18 DIAGNOSIS — E785 Hyperlipidemia, unspecified: Secondary | ICD-10-CM | POA: Diagnosis not present

## 2021-06-18 DIAGNOSIS — I10 Essential (primary) hypertension: Secondary | ICD-10-CM | POA: Diagnosis not present

## 2021-06-18 DIAGNOSIS — E1169 Type 2 diabetes mellitus with other specified complication: Secondary | ICD-10-CM | POA: Diagnosis not present

## 2021-06-19 DIAGNOSIS — I10 Essential (primary) hypertension: Secondary | ICD-10-CM | POA: Diagnosis not present

## 2021-06-24 DIAGNOSIS — H40013 Open angle with borderline findings, low risk, bilateral: Secondary | ICD-10-CM | POA: Diagnosis not present

## 2021-07-14 DIAGNOSIS — I10 Essential (primary) hypertension: Secondary | ICD-10-CM | POA: Diagnosis not present

## 2021-07-14 DIAGNOSIS — E1169 Type 2 diabetes mellitus with other specified complication: Secondary | ICD-10-CM | POA: Diagnosis not present

## 2021-07-14 DIAGNOSIS — E785 Hyperlipidemia, unspecified: Secondary | ICD-10-CM | POA: Diagnosis not present

## 2021-07-21 DIAGNOSIS — I10 Essential (primary) hypertension: Secondary | ICD-10-CM | POA: Diagnosis not present

## 2021-11-19 DIAGNOSIS — E119 Type 2 diabetes mellitus without complications: Secondary | ICD-10-CM | POA: Diagnosis not present

## 2021-11-19 DIAGNOSIS — Z7984 Long term (current) use of oral hypoglycemic drugs: Secondary | ICD-10-CM | POA: Diagnosis not present

## 2021-11-19 DIAGNOSIS — Z809 Family history of malignant neoplasm, unspecified: Secondary | ICD-10-CM | POA: Diagnosis not present

## 2021-11-19 DIAGNOSIS — I10 Essential (primary) hypertension: Secondary | ICD-10-CM | POA: Diagnosis not present

## 2021-11-19 DIAGNOSIS — Z833 Family history of diabetes mellitus: Secondary | ICD-10-CM | POA: Diagnosis not present

## 2021-11-19 DIAGNOSIS — N529 Male erectile dysfunction, unspecified: Secondary | ICD-10-CM | POA: Diagnosis not present

## 2021-11-19 DIAGNOSIS — Z8249 Family history of ischemic heart disease and other diseases of the circulatory system: Secondary | ICD-10-CM | POA: Diagnosis not present

## 2021-11-19 DIAGNOSIS — R69 Illness, unspecified: Secondary | ICD-10-CM | POA: Diagnosis not present

## 2021-11-19 DIAGNOSIS — N400 Enlarged prostate without lower urinary tract symptoms: Secondary | ICD-10-CM | POA: Diagnosis not present

## 2021-12-02 DIAGNOSIS — R3911 Hesitancy of micturition: Secondary | ICD-10-CM | POA: Diagnosis not present

## 2021-12-02 DIAGNOSIS — D696 Thrombocytopenia, unspecified: Secondary | ICD-10-CM | POA: Diagnosis not present

## 2021-12-02 DIAGNOSIS — E1169 Type 2 diabetes mellitus with other specified complication: Secondary | ICD-10-CM | POA: Diagnosis not present

## 2021-12-02 DIAGNOSIS — N529 Male erectile dysfunction, unspecified: Secondary | ICD-10-CM | POA: Diagnosis not present

## 2021-12-02 DIAGNOSIS — K760 Fatty (change of) liver, not elsewhere classified: Secondary | ICD-10-CM | POA: Diagnosis not present

## 2021-12-02 DIAGNOSIS — I10 Essential (primary) hypertension: Secondary | ICD-10-CM | POA: Diagnosis not present

## 2021-12-02 DIAGNOSIS — D72819 Decreased white blood cell count, unspecified: Secondary | ICD-10-CM | POA: Diagnosis not present

## 2021-12-02 DIAGNOSIS — R69 Illness, unspecified: Secondary | ICD-10-CM | POA: Diagnosis not present

## 2021-12-02 DIAGNOSIS — Z0000 Encounter for general adult medical examination without abnormal findings: Secondary | ICD-10-CM | POA: Diagnosis not present

## 2021-12-02 DIAGNOSIS — E785 Hyperlipidemia, unspecified: Secondary | ICD-10-CM | POA: Diagnosis not present

## 2021-12-02 DIAGNOSIS — Z125 Encounter for screening for malignant neoplasm of prostate: Secondary | ICD-10-CM | POA: Diagnosis not present

## 2022-01-13 DIAGNOSIS — Z0100 Encounter for examination of eyes and vision without abnormal findings: Secondary | ICD-10-CM | POA: Diagnosis not present

## 2022-01-13 DIAGNOSIS — E119 Type 2 diabetes mellitus without complications: Secondary | ICD-10-CM | POA: Diagnosis not present

## 2022-02-09 DIAGNOSIS — R059 Cough, unspecified: Secondary | ICD-10-CM | POA: Diagnosis not present

## 2022-02-09 DIAGNOSIS — U071 COVID-19: Secondary | ICD-10-CM | POA: Diagnosis not present

## 2022-02-09 DIAGNOSIS — R0981 Nasal congestion: Secondary | ICD-10-CM | POA: Diagnosis not present

## 2022-06-09 DIAGNOSIS — I10 Essential (primary) hypertension: Secondary | ICD-10-CM | POA: Diagnosis not present

## 2022-06-09 DIAGNOSIS — N529 Male erectile dysfunction, unspecified: Secondary | ICD-10-CM | POA: Diagnosis not present

## 2022-06-09 DIAGNOSIS — Z6828 Body mass index (BMI) 28.0-28.9, adult: Secondary | ICD-10-CM | POA: Diagnosis not present

## 2022-06-09 DIAGNOSIS — R69 Illness, unspecified: Secondary | ICD-10-CM | POA: Diagnosis not present

## 2022-06-09 DIAGNOSIS — E1169 Type 2 diabetes mellitus with other specified complication: Secondary | ICD-10-CM | POA: Diagnosis not present

## 2022-07-05 DIAGNOSIS — H40013 Open angle with borderline findings, low risk, bilateral: Secondary | ICD-10-CM | POA: Diagnosis not present

## 2022-08-24 DIAGNOSIS — K219 Gastro-esophageal reflux disease without esophagitis: Secondary | ICD-10-CM | POA: Diagnosis not present

## 2022-12-09 DIAGNOSIS — D696 Thrombocytopenia, unspecified: Secondary | ICD-10-CM | POA: Diagnosis not present

## 2022-12-09 DIAGNOSIS — F515 Nightmare disorder: Secondary | ICD-10-CM | POA: Diagnosis not present

## 2022-12-09 DIAGNOSIS — E1136 Type 2 diabetes mellitus with diabetic cataract: Secondary | ICD-10-CM | POA: Diagnosis not present

## 2022-12-09 DIAGNOSIS — K760 Fatty (change of) liver, not elsewhere classified: Secondary | ICD-10-CM | POA: Diagnosis not present

## 2022-12-09 DIAGNOSIS — F411 Generalized anxiety disorder: Secondary | ICD-10-CM | POA: Diagnosis not present

## 2022-12-09 DIAGNOSIS — K219 Gastro-esophageal reflux disease without esophagitis: Secondary | ICD-10-CM | POA: Diagnosis not present

## 2022-12-09 DIAGNOSIS — E1165 Type 2 diabetes mellitus with hyperglycemia: Secondary | ICD-10-CM | POA: Diagnosis not present

## 2022-12-09 DIAGNOSIS — N529 Male erectile dysfunction, unspecified: Secondary | ICD-10-CM | POA: Diagnosis not present

## 2022-12-09 DIAGNOSIS — Z0000 Encounter for general adult medical examination without abnormal findings: Secondary | ICD-10-CM | POA: Diagnosis not present

## 2022-12-09 DIAGNOSIS — I10 Essential (primary) hypertension: Secondary | ICD-10-CM | POA: Diagnosis not present

## 2022-12-09 DIAGNOSIS — Z125 Encounter for screening for malignant neoplasm of prostate: Secondary | ICD-10-CM | POA: Diagnosis not present

## 2022-12-09 DIAGNOSIS — D72819 Decreased white blood cell count, unspecified: Secondary | ICD-10-CM | POA: Diagnosis not present

## 2022-12-09 DIAGNOSIS — E785 Hyperlipidemia, unspecified: Secondary | ICD-10-CM | POA: Diagnosis not present

## 2023-01-18 DIAGNOSIS — Z0100 Encounter for examination of eyes and vision without abnormal findings: Secondary | ICD-10-CM | POA: Diagnosis not present

## 2023-01-19 DIAGNOSIS — H524 Presbyopia: Secondary | ICD-10-CM | POA: Diagnosis not present

## 2023-01-19 DIAGNOSIS — E119 Type 2 diabetes mellitus without complications: Secondary | ICD-10-CM | POA: Diagnosis not present

## 2023-04-27 DIAGNOSIS — H9319 Tinnitus, unspecified ear: Secondary | ICD-10-CM | POA: Diagnosis not present

## 2023-04-27 DIAGNOSIS — E785 Hyperlipidemia, unspecified: Secondary | ICD-10-CM | POA: Diagnosis not present

## 2023-06-08 DIAGNOSIS — F411 Generalized anxiety disorder: Secondary | ICD-10-CM | POA: Diagnosis not present

## 2023-06-08 DIAGNOSIS — I10 Essential (primary) hypertension: Secondary | ICD-10-CM | POA: Diagnosis not present

## 2023-06-08 DIAGNOSIS — K219 Gastro-esophageal reflux disease without esophagitis: Secondary | ICD-10-CM | POA: Diagnosis not present

## 2023-06-08 DIAGNOSIS — R3911 Hesitancy of micturition: Secondary | ICD-10-CM | POA: Diagnosis not present

## 2023-06-08 DIAGNOSIS — Z6827 Body mass index (BMI) 27.0-27.9, adult: Secondary | ICD-10-CM | POA: Diagnosis not present

## 2023-06-08 DIAGNOSIS — N529 Male erectile dysfunction, unspecified: Secondary | ICD-10-CM | POA: Diagnosis not present

## 2023-06-08 DIAGNOSIS — E1165 Type 2 diabetes mellitus with hyperglycemia: Secondary | ICD-10-CM | POA: Diagnosis not present

## 2023-06-08 DIAGNOSIS — K760 Fatty (change of) liver, not elsewhere classified: Secondary | ICD-10-CM | POA: Diagnosis not present

## 2023-06-08 DIAGNOSIS — E1136 Type 2 diabetes mellitus with diabetic cataract: Secondary | ICD-10-CM | POA: Diagnosis not present

## 2023-06-08 DIAGNOSIS — E785 Hyperlipidemia, unspecified: Secondary | ICD-10-CM | POA: Diagnosis not present

## 2023-07-21 DIAGNOSIS — H40013 Open angle with borderline findings, low risk, bilateral: Secondary | ICD-10-CM | POA: Diagnosis not present

## 2023-10-19 DIAGNOSIS — I10 Essential (primary) hypertension: Secondary | ICD-10-CM | POA: Diagnosis not present

## 2023-10-19 DIAGNOSIS — Z6828 Body mass index (BMI) 28.0-28.9, adult: Secondary | ICD-10-CM | POA: Diagnosis not present

## 2023-10-19 DIAGNOSIS — K219 Gastro-esophageal reflux disease without esophagitis: Secondary | ICD-10-CM | POA: Diagnosis not present

## 2024-01-18 DIAGNOSIS — E119 Type 2 diabetes mellitus without complications: Secondary | ICD-10-CM | POA: Diagnosis not present

## 2024-01-19 DIAGNOSIS — E1169 Type 2 diabetes mellitus with other specified complication: Secondary | ICD-10-CM | POA: Diagnosis not present

## 2024-01-19 DIAGNOSIS — I10 Essential (primary) hypertension: Secondary | ICD-10-CM | POA: Diagnosis not present

## 2024-01-19 DIAGNOSIS — E1165 Type 2 diabetes mellitus with hyperglycemia: Secondary | ICD-10-CM | POA: Diagnosis not present

## 2024-01-19 DIAGNOSIS — E785 Hyperlipidemia, unspecified: Secondary | ICD-10-CM | POA: Diagnosis not present

## 2024-02-19 DIAGNOSIS — E1165 Type 2 diabetes mellitus with hyperglycemia: Secondary | ICD-10-CM | POA: Diagnosis not present

## 2024-02-19 DIAGNOSIS — E785 Hyperlipidemia, unspecified: Secondary | ICD-10-CM | POA: Diagnosis not present

## 2024-02-19 DIAGNOSIS — I10 Essential (primary) hypertension: Secondary | ICD-10-CM | POA: Diagnosis not present

## 2024-02-19 DIAGNOSIS — E1169 Type 2 diabetes mellitus with other specified complication: Secondary | ICD-10-CM | POA: Diagnosis not present

## 2024-03-20 DIAGNOSIS — E785 Hyperlipidemia, unspecified: Secondary | ICD-10-CM | POA: Diagnosis not present

## 2024-03-20 DIAGNOSIS — E1165 Type 2 diabetes mellitus with hyperglycemia: Secondary | ICD-10-CM | POA: Diagnosis not present

## 2024-03-20 DIAGNOSIS — I10 Essential (primary) hypertension: Secondary | ICD-10-CM | POA: Diagnosis not present

## 2024-03-20 DIAGNOSIS — E1169 Type 2 diabetes mellitus with other specified complication: Secondary | ICD-10-CM | POA: Diagnosis not present

## 2024-04-20 DIAGNOSIS — E785 Hyperlipidemia, unspecified: Secondary | ICD-10-CM | POA: Diagnosis not present

## 2024-04-20 DIAGNOSIS — I10 Essential (primary) hypertension: Secondary | ICD-10-CM | POA: Diagnosis not present

## 2024-04-20 DIAGNOSIS — E1169 Type 2 diabetes mellitus with other specified complication: Secondary | ICD-10-CM | POA: Diagnosis not present

## 2024-04-20 DIAGNOSIS — E1165 Type 2 diabetes mellitus with hyperglycemia: Secondary | ICD-10-CM | POA: Diagnosis not present

## 2024-06-13 DIAGNOSIS — E1169 Type 2 diabetes mellitus with other specified complication: Secondary | ICD-10-CM | POA: Diagnosis not present

## 2024-06-13 DIAGNOSIS — K760 Fatty (change of) liver, not elsewhere classified: Secondary | ICD-10-CM | POA: Diagnosis not present

## 2024-06-13 DIAGNOSIS — F411 Generalized anxiety disorder: Secondary | ICD-10-CM | POA: Diagnosis not present

## 2024-06-13 DIAGNOSIS — I10 Essential (primary) hypertension: Secondary | ICD-10-CM | POA: Diagnosis not present

## 2024-06-13 DIAGNOSIS — Z6827 Body mass index (BMI) 27.0-27.9, adult: Secondary | ICD-10-CM | POA: Diagnosis not present

## 2024-06-13 DIAGNOSIS — K148 Other diseases of tongue: Secondary | ICD-10-CM | POA: Diagnosis not present

## 2024-06-13 DIAGNOSIS — E785 Hyperlipidemia, unspecified: Secondary | ICD-10-CM | POA: Diagnosis not present

## 2024-07-30 ENCOUNTER — Institutional Professional Consult (permissible substitution) (INDEPENDENT_AMBULATORY_CARE_PROVIDER_SITE_OTHER)

## 2024-08-14 ENCOUNTER — Institutional Professional Consult (permissible substitution) (INDEPENDENT_AMBULATORY_CARE_PROVIDER_SITE_OTHER)
